# Patient Record
Sex: Female | Born: 1985 | Race: Black or African American | Hispanic: No | Marital: Married | Smoking: Never smoker
Health system: Southern US, Community
[De-identification: ages and names within clinical notes are randomized; demographics above are authoritative.]

## PROBLEM LIST (undated history)

## (undated) DIAGNOSIS — Z8742 Personal history of other diseases of the female genital tract: Secondary | ICD-10-CM

## (undated) DIAGNOSIS — E669 Obesity, unspecified: Secondary | ICD-10-CM

## (undated) DIAGNOSIS — O24419 Gestational diabetes mellitus in pregnancy, unspecified control: Secondary | ICD-10-CM

## (undated) HISTORY — PX: INTRAUTERINE DEVICE (IUD) INSERTION: SHX5877

## (undated) HISTORY — DX: Personal history of other diseases of the female genital tract: Z87.42

## (undated) HISTORY — DX: Obesity, unspecified: E66.9

## (undated) HISTORY — DX: Gestational diabetes mellitus in pregnancy, unspecified control: O24.419

---

## 2009-04-04 ENCOUNTER — Ambulatory Visit: Payer: Self-pay | Admitting: Family Medicine

## 2009-04-13 ENCOUNTER — Encounter: Payer: Self-pay | Admitting: Maternal & Fetal Medicine

## 2009-05-18 ENCOUNTER — Encounter: Payer: Self-pay | Admitting: Obstetrics and Gynecology

## 2009-05-23 ENCOUNTER — Encounter: Payer: Self-pay | Admitting: Obstetrics and Gynecology

## 2009-09-01 ENCOUNTER — Observation Stay: Payer: Self-pay | Admitting: Obstetrics and Gynecology

## 2009-09-05 ENCOUNTER — Inpatient Hospital Stay: Payer: Self-pay | Admitting: Obstetrics and Gynecology

## 2009-11-22 IMAGING — US US OB FOLLOW-UP - NRPT [facility]
1 series · 14 of 28 positions shown · non-contrast
Comparison: none

[Series 1: us ob follow-up - nrpt [facility] · 14 of 52 slices shown]
[im 2/52]
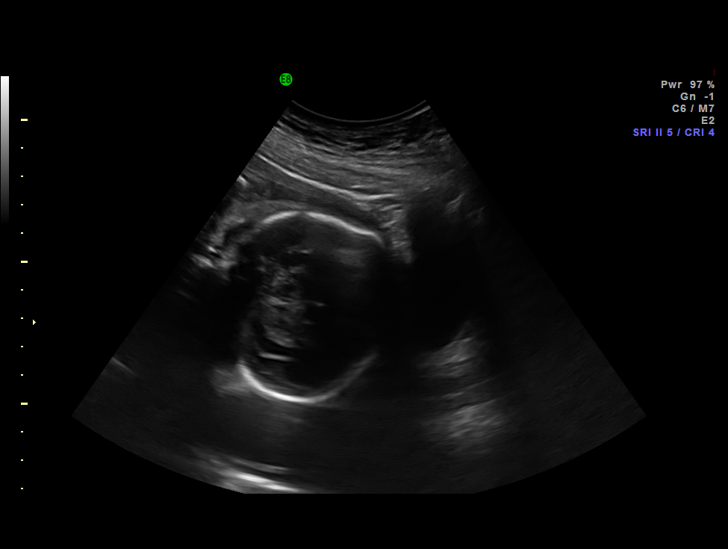
[im 6/52]
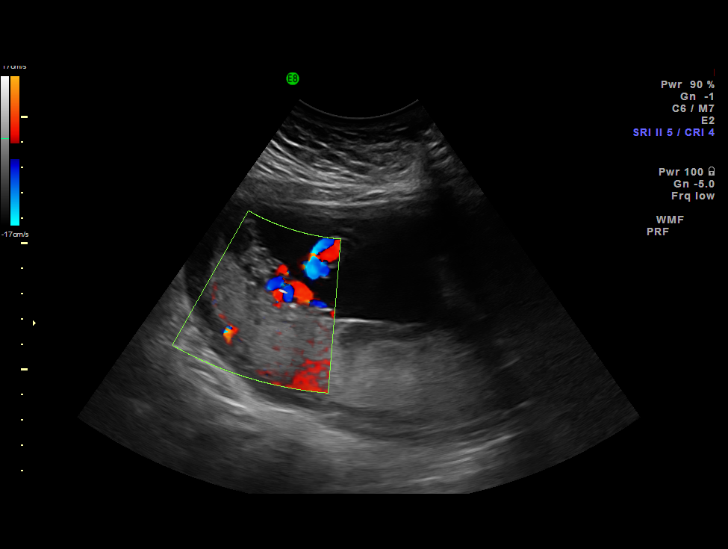
[im 10/52]
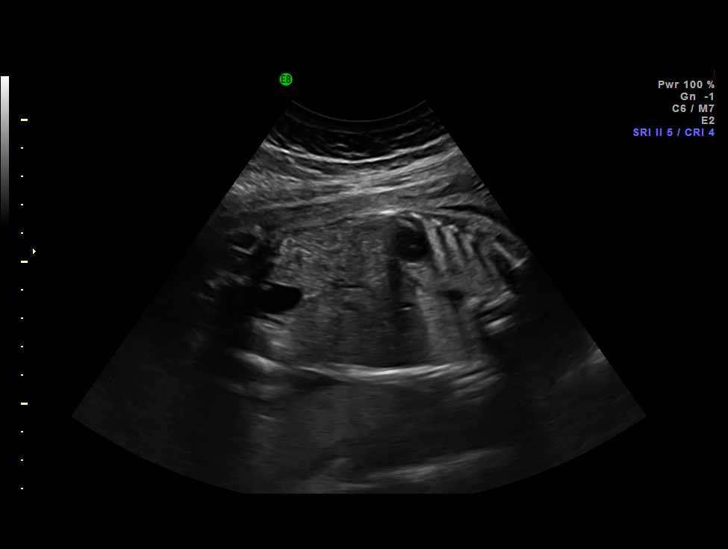
[im 14/52]
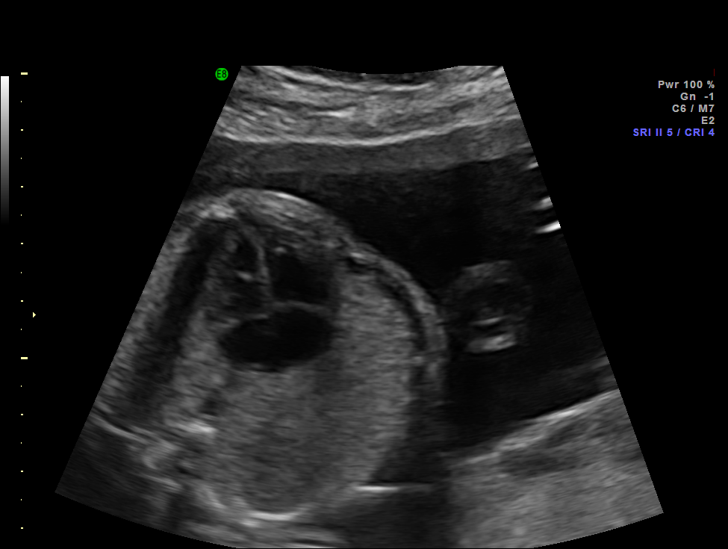
[im 18/52]
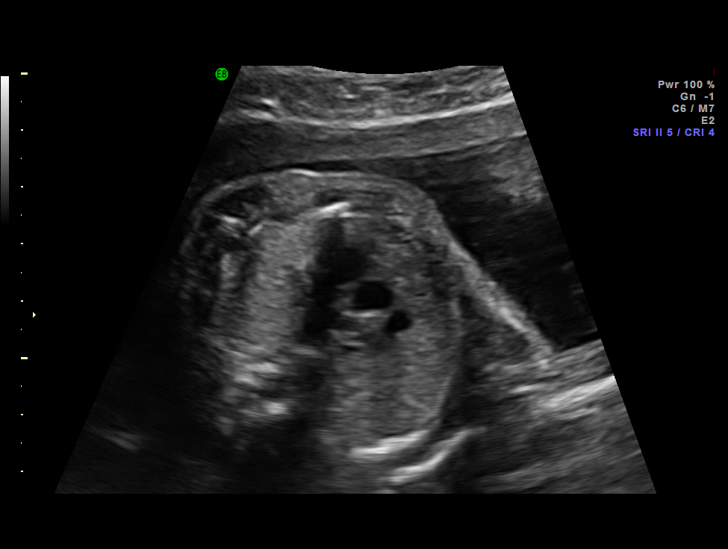
[im 21/52]
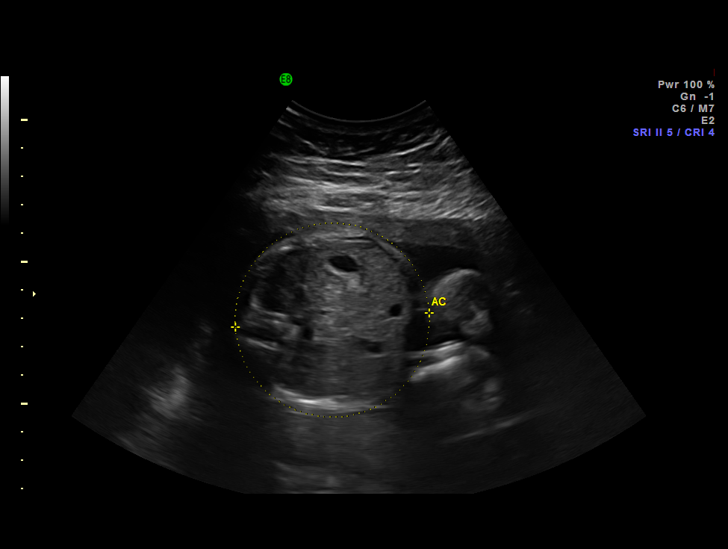
[im 25/52]
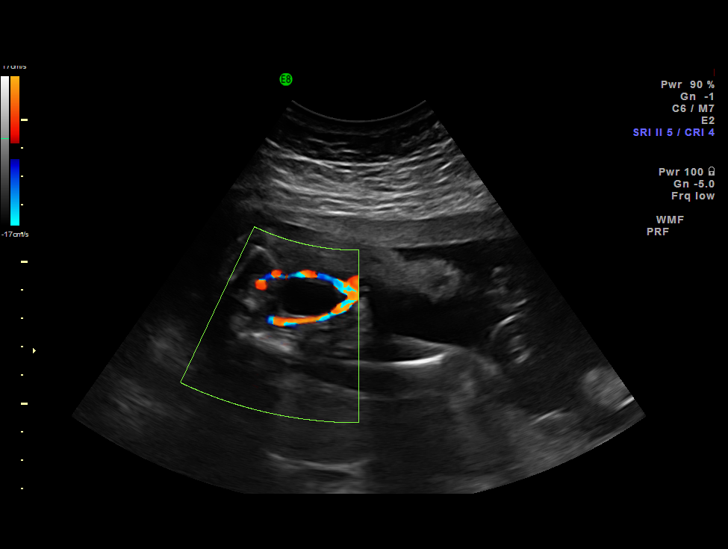
[im 29/52]
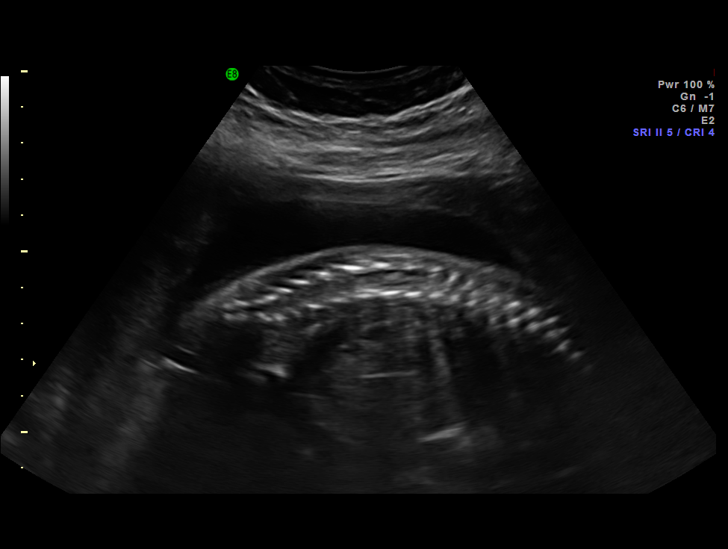
[im 33/52]
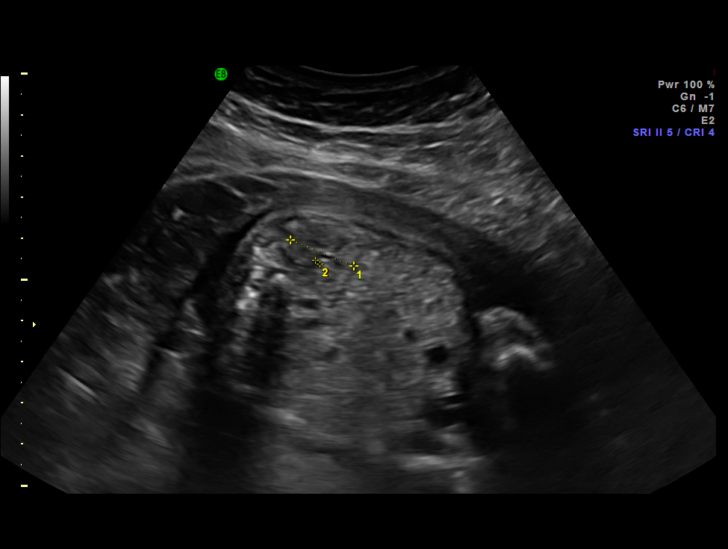
[im 36/52]
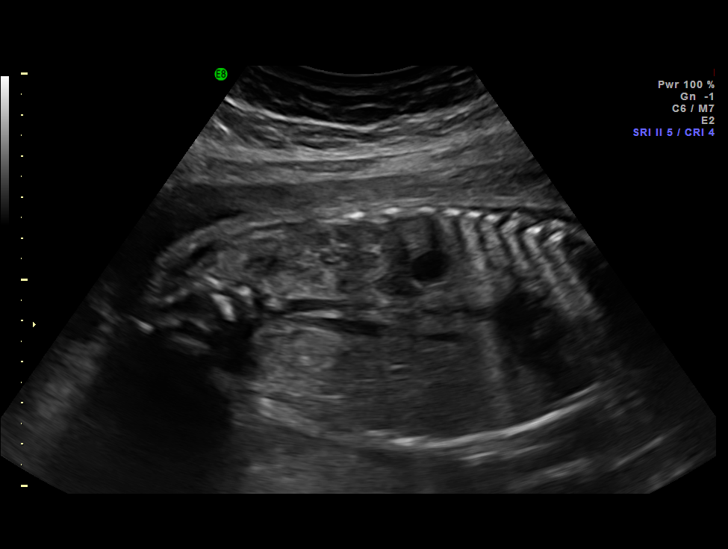
[im 40/52]
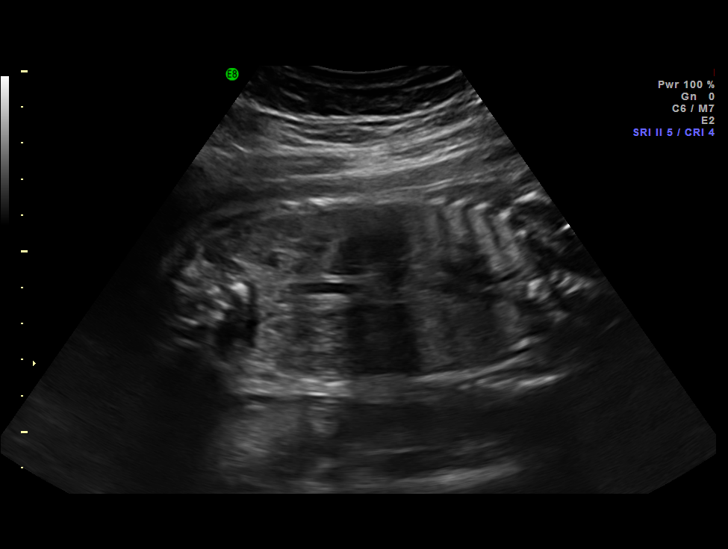
[im 44/52]
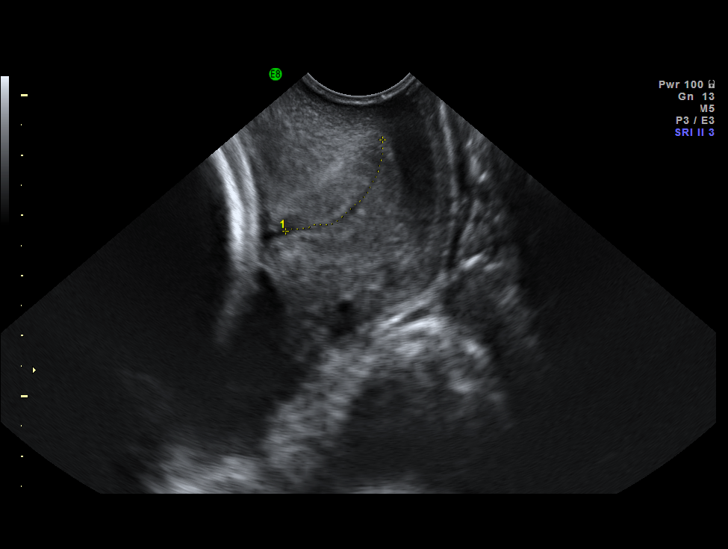
[im 48/52]
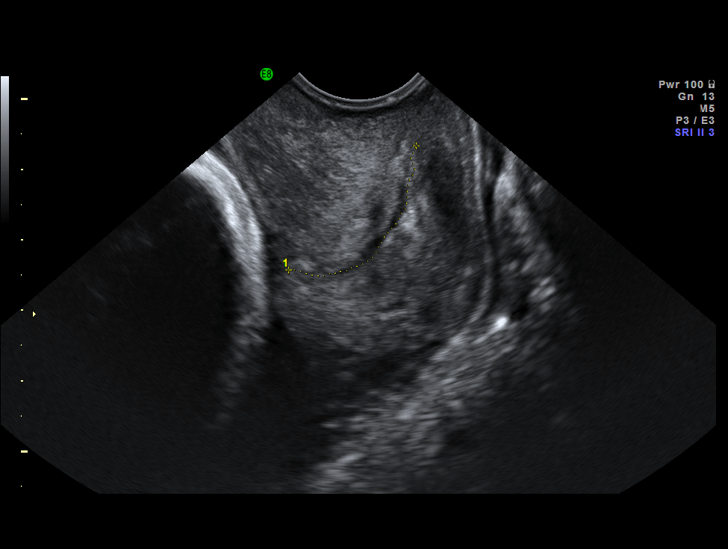
[im 52/52]
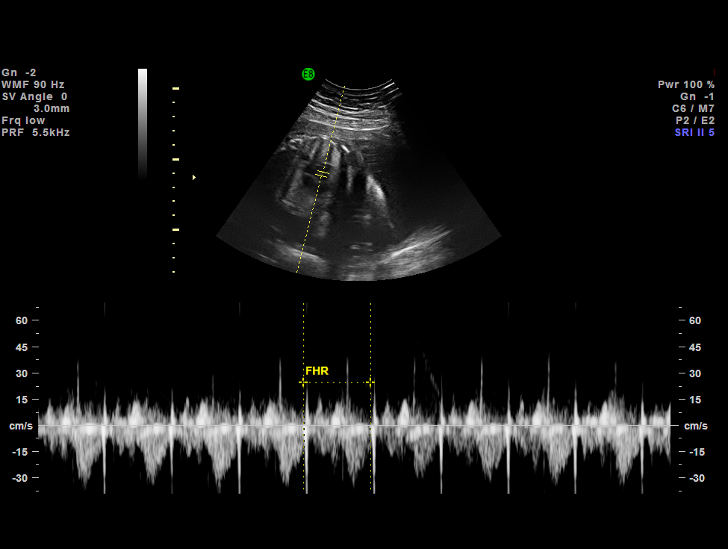

[14 of 28 positions shown; findings below may reference images not displayed]

IMAGES IMPORTED FROM THE [employer] WORKFLOW SYSTEM
NO DICTATION FOR STUDY

## 2010-11-10 ENCOUNTER — Emergency Department: Payer: Self-pay | Admitting: Emergency Medicine

## 2018-02-06 ENCOUNTER — Encounter: Payer: Self-pay | Admitting: Obstetrics and Gynecology

## 2018-02-06 ENCOUNTER — Ambulatory Visit: Payer: 59 | Admitting: Obstetrics and Gynecology

## 2018-02-06 VITALS — BP 120/70 | HR 81 | Ht 70.0 in | Wt 243.0 lb

## 2018-02-06 DIAGNOSIS — N760 Acute vaginitis: Secondary | ICD-10-CM | POA: Diagnosis not present

## 2018-02-06 MED ORDER — METRONIDAZOLE 500 MG PO TABS: 500.0000 mg | ORAL_TABLET | Freq: Two times a day (BID) | ORAL | 0 refills | Status: AC

## 2018-02-06 NOTE — Progress Notes (Signed)
Patient ID: Karina Mason, female   DOB: 10/08/1986, 32 y.o.   MRN: [id number]  Reason for Consult: Vaginitis   Referred by No ref. provider found  Subjective:     HPI:  Karina CrimesCherelle R Olivar is a 32 y.o. female patient presents today with complaints of abnormal vaginal discharge, pain and burning that has been present for about 1 week. She recently changed soap from dove to nivea.   OB History  Gravida Para Term Preterm AB Living  1 1 1     1   SAB TAB Ectopic Multiple Live Births          1    # Outcome Date GA Lbr Len/2nd Weight Sex Delivery Anes PTL Lv  1 Term 09/06/09    F Vag-Spont   LIV      Past Medical History:  Diagnosis Date  . History of abnormal cervical Pap smear    Family History  Problem Relation Age of Onset  . Hypertension Mother   . Diabetes Father   . Hypertension Father   . Stroke Maternal Grandmother   . Leukemia Paternal Grandfather    Past Surgical History:  Procedure Laterality Date  . INTRAUTERINE DEVICE (IUD) INSERTION      Short Social History:  Social History   Tobacco Use  . Smoking status: Never Smoker  . Smokeless tobacco: Never Used  Substance Use Topics  . Alcohol use: No    Frequency: Never    No Known Allergies  Current Outpatient Medications  Medication Sig Dispense Refill  . levonorgestrel (MIRENA) 20 MCG/24HR IUD 1 each by Intrauterine route once.     No current facility-administered medications for this visit.     Review of Systems  Constitutional: Negative for chills, fatigue, fever and unexpected weight change.  HENT: Negative for trouble swallowing.  Eyes: Negative for loss of vision.  Respiratory: Negative for cough, shortness of breath and wheezing.  Cardiovascular: Negative for chest pain, leg swelling, palpitations and syncope.  GI: Negative for abdominal pain, blood in stool, diarrhea, nausea and vomiting.  GU: Negative for difficulty urinating, dysuria, frequency and hematuria.  Musculoskeletal:  Negative for back pain, leg pain and joint pain.  Skin: Negative for rash.  Neurological: Negative for dizziness, headaches, light-headedness, numbness and seizures.  Psychiatric: Negative for behavioral problem, confusion, depressed mood and sleep disturbance.        Objective:  Objective   Vitals:   02/06/18 1440  BP: 120/70  Pulse: 81  Weight: 243 lb (110.2 kg)  Height: 5\' 10"  (1.778 m)   Body mass index is 34.87 kg/m.  Physical Exam  Constitutional: She is oriented to person, place, and time. She appears well-developed and well-nourished.  HENT:  Head: Normocephalic and atraumatic.  Eyes: EOM are normal.  Cardiovascular: Normal rate, regular rhythm and normal heart sounds.  Pulmonary/Chest: Effort normal and breath sounds normal.  Genitourinary: Vaginal discharge found.  Genitourinary Comments: Thin white vaginal discharge, +odor  Neurological: She is alert and oriented to person, place, and time.  Skin: Skin is warm and dry.  Psychiatric: She has a normal mood and affect. Her behavior is normal. Judgment and thought content normal.  Nursing note and vitals reviewed.   WETMOUNT: Microscopic wet-mount exam shows KOH done, whiff test positive. No clue cells, no yeast seen     Assessment/Plan:    Vaginitis-   Will start patient on flagyl. Will send NuSwab to confirm infection.  Natale Milchhristanna R Jazmynn Pho MD [facility] 02/06/18 3:08  PM

## 2018-02-10 LAB — NUSWAB BV AND CANDIDA, NAA
Candida albicans, NAA: POSITIVE — AB
Candida glabrata, NAA: NEGATIVE

## 2018-02-11 ENCOUNTER — Other Ambulatory Visit: Payer: Self-pay | Admitting: Obstetrics and Gynecology

## 2018-02-11 DIAGNOSIS — N760 Acute vaginitis: Secondary | ICD-10-CM

## 2018-02-11 MED ORDER — FLUCONAZOLE 150 MG PO TABS: 150.0000 mg | ORAL_TABLET | Freq: Once | ORAL | 3 refills | Status: AC

## 2018-02-11 NOTE — Progress Notes (Signed)
Patient will need to take diflucan for infection, I started her on flagyl which she can discontinue. I sent the prescription to her pharmacy. I called her but she did not answer and voicemail was full. Will you please try to contact patient? Thank you! -Dr. Jerene PitchSchuman

## 2018-03-26 ENCOUNTER — Encounter: Payer: Self-pay | Admitting: Obstetrics and Gynecology

## 2018-03-26 ENCOUNTER — Ambulatory Visit (INDEPENDENT_AMBULATORY_CARE_PROVIDER_SITE_OTHER): Payer: 59 | Admitting: Obstetrics and Gynecology

## 2018-03-26 VITALS — BP 128/82 | HR 70 | Ht 70.0 in | Wt 246.0 lb

## 2018-03-26 DIAGNOSIS — M79604 Pain in right leg: Secondary | ICD-10-CM | POA: Diagnosis not present

## 2018-03-26 DIAGNOSIS — Z124 Encounter for screening for malignant neoplasm of cervix: Secondary | ICD-10-CM

## 2018-03-26 DIAGNOSIS — Z01419 Encounter for gynecological examination (general) (routine) without abnormal findings: Secondary | ICD-10-CM | POA: Diagnosis not present

## 2018-03-26 DIAGNOSIS — Z1151 Encounter for screening for human papillomavirus (HPV): Secondary | ICD-10-CM | POA: Diagnosis not present

## 2018-03-26 DIAGNOSIS — Z30431 Encounter for routine checking of intrauterine contraceptive device: Secondary | ICD-10-CM

## 2018-03-26 NOTE — Patient Instructions (Signed)
I value your feedback and entrusting us with your care. If you get a [facility] patient survey, I would appreciate you taking the time to let us know about your experience today. Thank you! 

## 2018-03-26 NOTE — Progress Notes (Signed)
PCP:  Patient, No Pcp Per   Chief Complaint  Patient presents with  . Gynecologic Exam     HPI:      [name] is a 32 y.o. G1P1001 who LMP was No LMP recorded. (Menstrual status: IUD)., presents today for her annual examination.  Her menses are absent with IUD. Dysmenorrhea none. She does not have intermenstrual bleeding.  Sex activity: single partner, contraception - IUD. Mirena placed 01/14/17 Last Pap: July 08, 2017  Results were: no abnormalities  Hx of STDs: none  Vaginitis sx treated by Dr. Jerene PitchSchuman 2/19 resolved.   There is no FH of breast cancer. There is no FH of ovarian cancer. The patient does not do self-breast exams.  Tobacco use: The patient denies current or previous tobacco use. Alcohol use: none No drug use.  Exercise: not active  She does get adequate calcium and Vitamin D in her diet.  She notes RT leg pain when she first stands up or gets up. Has a sit down job. Sx resolve once she gets moving around. No exercise.    Past Medical History:  Diagnosis Date  . History of abnormal cervical Pap smear     Past Surgical History:  Procedure Laterality Date  . INTRAUTERINE DEVICE (IUD) INSERTION      Family History  Problem Relation Age of Onset  . Hypertension Mother   . Diabetes Father   . Hypertension Father   . Stroke Maternal Grandmother   . Leukemia Paternal Grandfather     Social History   Socioeconomic History  . Marital status: Married    Spouse name: Not on file  . Number of children: Not on file  . Years of education: Not on file  . Highest education level: Not on file  Occupational History  . Not on file  Social Needs  . Financial resource strain: Not on file  . Food insecurity:    Worry: Not on file    Inability: Not on file  . Transportation needs:    Medical: Not on file    Non-medical: Not on file  Tobacco Use  . Smoking status: Never Smoker  . Smokeless tobacco: Never Used  Substance and Sexual Activity    . Alcohol use: No    Frequency: Never  . Drug use: No  . Sexual activity: Yes    Birth control/protection: IUD    Comment: Mirena   Lifestyle  . Physical activity:    Days per week: 0 days    Minutes per session: 0 min  . Stress: To some extent  Relationships  . Social connections:    Talks on phone: Not on file    Gets together: Not on file    Attends religious service: Not on file    Active member of club or organization: Not on file    Attends meetings of clubs or organizations: Not on file    Relationship status: Not on file  . Intimate partner violence:    Fear of current or ex partner: Not on file    Emotionally abused: Not on file    Physically abused: Not on file    Forced sexual activity: Not on file  Other Topics Concern  . Not on file  Social History Narrative  . Not on file    Outpatient Medications Prior to Visit  Medication Sig Dispense Refill  . levonorgestrel (MIRENA) 20 MCG/24HR IUD 1 each by Intrauterine route once.     No facility-administered  medications prior to visit.       ROS:  Review of Systems  Constitutional: Positive for fatigue. Negative for fever and unexpected weight change.  Respiratory: Negative for cough, shortness of breath and wheezing.   Cardiovascular: Negative for chest pain, palpitations and leg swelling.  Gastrointestinal: Negative for blood in stool, constipation, diarrhea, nausea and vomiting.  Endocrine: Negative for cold intolerance, heat intolerance and polyuria.  Genitourinary: Negative for dyspareunia, dysuria, flank pain, frequency, genital sores, hematuria, menstrual problem, pelvic pain, urgency, vaginal bleeding, vaginal discharge and vaginal pain.  Musculoskeletal: Positive for arthralgias and myalgias. Negative for back pain and joint swelling.  Skin: Negative for rash.  Neurological: Negative for dizziness, syncope, light-headedness, numbness and headaches.  Hematological: Negative for adenopathy.   Psychiatric/Behavioral: Negative for agitation, confusion, sleep disturbance and suicidal ideas. The patient is not nervous/anxious.    BREAST: No symptoms   Objective: BP 128/82   Pulse 70   Ht 5\' 10"  (1.778 m)   Wt 246 lb (111.6 kg)   BMI 35.30 kg/m    Physical Exam  Constitutional: She is oriented to person, place, and time. She appears well-developed and well-nourished.  Genitourinary: Vagina normal and uterus normal. There is no rash or tenderness on the right labia. There is no rash or tenderness on the left labia. No erythema or tenderness in the vagina. No vaginal discharge found. Right adnexum does not display mass and does not display tenderness. Left adnexum does not display mass and does not display tenderness.  Cervix exhibits visible IUD strings. Cervix does not exhibit motion tenderness or polyp. Uterus is not enlarged or tender.  Neck: Normal range of motion. No thyromegaly present.  Cardiovascular: Normal rate, regular rhythm and normal heart sounds.  No murmur heard. Pulmonary/Chest: Effort normal and breath sounds normal. Right breast exhibits no mass, no nipple discharge, no skin change and no tenderness. Left breast exhibits no mass, no nipple discharge, no skin change and no tenderness.  Abdominal: Soft. There is no tenderness. There is no guarding.  Musculoskeletal: Normal range of motion.  Neurological: She is alert and oriented to person, place, and time. No cranial nerve deficit.  Psychiatric: She has a normal mood and affect. Her behavior is normal.  Vitals reviewed.   Assessment/Plan: Encounter for annual routine gynecological examination  Cervical cancer screening - Plan: IGP, Aptima HPV  Screening for HPV (human papillomavirus) - Plan: IGP, Aptima HPV  Encounter for routine checking of intrauterine contraceptive device (IUD) - IUD in place. Due for rem 1/23.  Pain of right lower extremity - After sitting. Increase exercise/stretch/chiro. F/u prn.       GYN counsel adequate intake of calcium and vitamin D, diet and exercise     F/U  Return in about 1 year (around 03/27/2019).  Alicia B. Copland, PA-C 03/26/2018 11:51 AM

## 2018-03-30 LAB — IGP, APTIMA HPV
HPV Aptima: NEGATIVE
PAP Smear Comment: 0

## 2018-06-22 ENCOUNTER — Ambulatory Visit: Payer: 59 | Admitting: Advanced Practice Midwife

## 2019-01-13 ENCOUNTER — Ambulatory Visit: Payer: 59 | Admitting: Obstetrics and Gynecology

## 2019-01-21 ENCOUNTER — Ambulatory Visit (INDEPENDENT_AMBULATORY_CARE_PROVIDER_SITE_OTHER): Payer: 59 | Admitting: Obstetrics and Gynecology

## 2019-01-21 ENCOUNTER — Encounter: Payer: Self-pay | Admitting: Obstetrics and Gynecology

## 2019-01-21 VITALS — BP 130/80 | HR 58 | Ht 70.0 in | Wt 227.0 lb

## 2019-01-21 DIAGNOSIS — Z30432 Encounter for removal of intrauterine contraceptive device: Secondary | ICD-10-CM | POA: Diagnosis not present

## 2019-01-21 DIAGNOSIS — Z3169 Encounter for other general counseling and advice on procreation: Secondary | ICD-10-CM

## 2019-01-21 NOTE — Patient Instructions (Signed)
I value your feedback and entrusting us with your care. If you get a [facility] patient survey, I would appreciate you taking the time to let us know about your experience today. Thank you! 

## 2019-01-21 NOTE — Progress Notes (Signed)
   Chief Complaint  Patient presents with  . IUD removed     History of Present Illness:  Norma Zakarian Blancett is a 33 y.o. that had a Mirena IUD placed approximately 2 years ago. Since that time, she denies dyspareunia, pelvic pain, non-menstrual bleeding, vaginal d/c, heavy bleeding. She would like IUD removed in order to conceive.    BP 130/80   Pulse (!) 58   Ht 5\' 10"  (1.778 m)   Wt 227 lb (103 kg)   LMP 01/20/2019 (Exact Date)   BMI 32.57 kg/m   Pelvic exam:  Two IUD strings present seen coming from the cervical os. EGBUS, vaginal vault and cervix: within normal limits  IUD Removal Strings of IUD identified and grasped.  IUD removed without problem with ring forceps.  Pt tolerated this well.  IUD noted to be intact.  Assessment:  Encounter for removal of intrauterine contraceptive device (IUD)  Pre-conception counseling - Start PNVs.    Plan: IUD removed and plan for contraception is no method. She was amenable to this plan. Return in about 3 months (around 04/22/2019) for annual.   Annual due 4/20 if not pregnant.  Alicia B. Copland, PA-C 01/21/2019 3:58 PM

## 2019-03-17 ENCOUNTER — Telehealth: Payer: Self-pay | Admitting: Obstetrics and Gynecology

## 2019-03-17 NOTE — Telephone Encounter (Signed)
Spoke with pt. Having nausea today, vomiting last night. No pelvic pain, no other sx. Occas breast tenderness. Trying to conceive, not taking PNVs yet. Suggested she wait another couple days to take another first AM UPT. F/u prn NOB.

## 2019-03-17 NOTE — Telephone Encounter (Signed)
Late for period, had neg UPT yesterday. Had vomiting last night, feels sick, horrible today. When should she come in?

## 2019-06-10 ENCOUNTER — Telehealth: Payer: Self-pay

## 2019-06-10 NOTE — Telephone Encounter (Signed)
Please advise 

## 2019-06-10 NOTE — Telephone Encounter (Signed)
Pt calling; period is being short; the last 65m it's lasted only 2d; it's not light; this is different for her; is trying to conceive; concerned something isn't right.  Is there anything else she can do to have a better chance of conceiving?  [phone]

## 2019-06-10 NOTE — Telephone Encounter (Signed)
Have pt confirm not pregnant with UPT. If neg, can still conceive if flow is light.

## 2019-06-11 NOTE — Telephone Encounter (Signed)
Pt is aware.  

## 2019-07-15 NOTE — Patient Instructions (Signed)
I value your feedback and entrusting us with your care. If you get a [facility] patient survey, I would appreciate you taking the time to let us know about your experience today. Thank you! 

## 2019-07-15 NOTE — Progress Notes (Addendum)
PCP:  Patient, No Pcp Per   Chief Complaint  Patient presents with   Gynecologic Exam    pt would like UPT     HPI:      [name] is a 33 y.o. G1P1001 who LMP was Patient's last menstrual period was 06/07/2019 (exact date)., presents today for her annual examination.  Her menses are monthly, lasting 3 days, no BTB, Dysmenorrhea none. She is late for her period this month, trying to conceive. Not taking PNVs. Has had a little cramping, no spotting. Mild breast tenderness.  Sex activity: single partner, contraception - none, wants to conceive. Mirena placed 01/14/17, removed 1/20 for conception.  Last Pap: 03/26/18 Results were: no abnormalities /neg HPV DNA Hx of STDs: none  There is no FH of breast cancer. There is no FH of ovarian cancer. The patient does not do self-breast exams.  Tobacco use: The patient denies current or previous tobacco use. Alcohol use: none No drug use.  Exercise: mod active  She does get adequate calcium and Vitamin D in her diet.   Past Medical History:  Diagnosis Date   History of abnormal cervical Pap smear     Past Surgical History:  Procedure Laterality Date   INTRAUTERINE DEVICE (IUD) INSERTION      Family History  Problem Relation Age of Onset   Hypertension Mother    Diabetes Father        Type 2   Hypertension Father    Stroke Maternal Grandmother    Leukemia Paternal Grandfather    Hypertension Sister 30   Hypertension Brother [id number]   Other Maternal Grandfather        Malignant neoplasm    Social History   Socioeconomic History   Marital status: Married    Spouse name: Not on file   Number of children: Not on file   Years of education: Not on file   Highest education level: Not on file  Occupational History   Not on file  Social Needs   Financial resource strain: Not on file   Food insecurity    Worry: Not on file    Inability: Not on file   Transportation needs    Medical: Not on file      Non-medical: Not on file  Tobacco Use   Smoking status: Never Smoker   Smokeless tobacco: Never Used  Substance and Sexual Activity   Alcohol use: No    Frequency: Never   Drug use: No   Sexual activity: Yes    Birth control/protection: None  Lifestyle   Physical activity    Days per week: 0 days    Minutes per session: 0 min   Stress: To some extent  Relationships   Social connections    Talks on phone: Not on file    Gets together: Not on file    Attends religious service: Not on file    Active member of club or organization: Not on file    Attends meetings of clubs or organizations: Not on file    Relationship status: Not on file   Intimate partner violence    Fear of current or ex partner: Not on file    Emotionally abused: Not on file    Physically abused: Not on file    Forced sexual activity: Not on file  Other Topics Concern   Not on file  Social History Narrative   Not on file    No outpatient medications prior to  visit.   No facility-administered medications prior to visit.       ROS:  Review of Systems  Constitutional: Positive for fatigue. Negative for fever and unexpected weight change.  Respiratory: Negative for cough, shortness of breath and wheezing.   Cardiovascular: Negative for chest pain, palpitations and leg swelling.  Gastrointestinal: Negative for blood in stool, constipation, diarrhea, nausea and vomiting.  Endocrine: Negative for cold intolerance, heat intolerance and polyuria.  Genitourinary: Negative for dyspareunia, dysuria, flank pain, frequency, genital sores, hematuria, menstrual problem, pelvic pain, urgency, vaginal bleeding, vaginal discharge and vaginal pain.  Musculoskeletal: Positive for arthralgias and myalgias. Negative for back pain and joint swelling.  Skin: Negative for rash.  Neurological: Negative for dizziness, syncope, light-headedness, numbness and headaches.  Hematological: Negative for adenopathy.   Psychiatric/Behavioral: Negative for agitation, confusion, sleep disturbance and suicidal ideas. The patient is not nervous/anxious.    BREAST: No symptoms   Objective: BP 120/70    Ht 5\' 10"  (1.778 m)    Wt 259 lb (117.5 kg)    LMP 06/07/2019 (Exact Date)    BMI 37.16 kg/m    Physical Exam Constitutional:      Appearance: She is well-developed.  Genitourinary:     Vagina and uterus normal.     No vaginal discharge, erythema or tenderness.     No cervical motion tenderness or polyp.     IUD strings visualized.     Uterus is not enlarged or tender.     No right or left adnexal mass present.     Right adnexa not tender.     Left adnexa not tender.  Neck:     Musculoskeletal: Normal range of motion.     Thyroid: No thyromegaly.  Cardiovascular:     Rate and Rhythm: Normal rate and regular rhythm.     Heart sounds: Normal heart sounds. No murmur.  Pulmonary:     Effort: Pulmonary effort is normal.     Breath sounds: Normal breath sounds.  Chest:     Breasts:        Right: No mass, nipple discharge, skin change or tenderness.        Left: No mass, nipple discharge, skin change or tenderness.  Abdominal:     Palpations: Abdomen is soft.     Tenderness: There is no abdominal tenderness. There is no guarding.  Musculoskeletal: Normal range of motion.  Neurological:     Mental Status: She is alert and oriented to person, place, and time.     Cranial Nerves: No cranial nerve deficit.  Psychiatric:        Behavior: Behavior normal.  Vitals signs reviewed.     Assessment/Plan: Encounter for annual routine gynecological examination -   Positive pregnancy test - Plan: POCT urine pregnancy, EDD 03/11/20, 5 5/7 wks. RTO for NOB. Start PNVs. Gon/chlam screen today.     Screening for STD  GYN counsel adequate intake of calcium and vitamin D, diet and exercise     F/U  Return in about 2 weeks (around 07/30/2019) for NOB.  Kinzly Pierrelouis B. Miyanna Wiersma, PA-C 07/16/2019 8:57 AM

## 2019-07-16 ENCOUNTER — Encounter: Payer: Self-pay | Admitting: Obstetrics and Gynecology

## 2019-07-16 ENCOUNTER — Other Ambulatory Visit (HOSPITAL_COMMUNITY)
Admission: RE | Admit: 2019-07-16 | Discharge: 2019-07-16 | Disposition: A | Discharge status: Home / Self Care | Payer: 59 | Source: Ambulatory Visit | Attending: Obstetrics and Gynecology | Admitting: Obstetrics and Gynecology

## 2019-07-16 ENCOUNTER — Other Ambulatory Visit: Payer: Self-pay

## 2019-07-16 ENCOUNTER — Ambulatory Visit (INDEPENDENT_AMBULATORY_CARE_PROVIDER_SITE_OTHER): Payer: 59 | Admitting: Obstetrics and Gynecology

## 2019-07-16 VITALS — BP 120/70 | Ht 70.0 in | Wt 259.0 lb

## 2019-07-16 DIAGNOSIS — Z01419 Encounter for gynecological examination (general) (routine) without abnormal findings: Secondary | ICD-10-CM | POA: Diagnosis not present

## 2019-07-16 DIAGNOSIS — Z3201 Encounter for pregnancy test, result positive: Secondary | ICD-10-CM | POA: Diagnosis not present

## 2019-07-16 DIAGNOSIS — Z113 Encounter for screening for infections with a predominantly sexual mode of transmission: Secondary | ICD-10-CM | POA: Insufficient documentation

## 2019-07-16 LAB — POCT URINE PREGNANCY: Preg Test, Ur: POSITIVE — AB

## 2019-07-17 LAB — CERVICOVAGINAL ANCILLARY ONLY
Chlamydia: NEGATIVE
Neisseria Gonorrhea: NEGATIVE

## 2019-08-04 ENCOUNTER — Other Ambulatory Visit: Payer: Self-pay

## 2019-08-04 ENCOUNTER — Encounter: Payer: Self-pay | Admitting: Maternal Newborn

## 2019-08-04 ENCOUNTER — Ambulatory Visit (INDEPENDENT_AMBULATORY_CARE_PROVIDER_SITE_OTHER): Payer: 59 | Admitting: Maternal Newborn

## 2019-08-04 VITALS — BP 130/80 | Wt 261.0 lb

## 2019-08-04 DIAGNOSIS — Z6837 Body mass index (BMI) 37.0-37.9, adult: Secondary | ICD-10-CM

## 2019-08-04 DIAGNOSIS — O0990 Supervision of high risk pregnancy, unspecified, unspecified trimester: Secondary | ICD-10-CM | POA: Insufficient documentation

## 2019-08-04 DIAGNOSIS — Z3481 Encounter for supervision of other normal pregnancy, first trimester: Secondary | ICD-10-CM

## 2019-08-04 DIAGNOSIS — Z369 Encounter for antenatal screening, unspecified: Secondary | ICD-10-CM

## 2019-08-04 DIAGNOSIS — Z3A08 8 weeks gestation of pregnancy: Secondary | ICD-10-CM

## 2019-08-04 DIAGNOSIS — Z3480 Encounter for supervision of other normal pregnancy, unspecified trimester: Secondary | ICD-10-CM

## 2019-08-04 HISTORY — DX: Supervision of high risk pregnancy, unspecified, unspecified trimester: O09.90

## 2019-08-04 NOTE — Progress Notes (Signed)
08/04/2019   Chief Complaint: Desires prenatal care.  Transfer of Care Patient: No  History of Present Illness: [name] is a 33 y.o. G2P1001 at 2968w2d based on Patient's last menstrual period on 06/07/2019 (exact date), with an Estimated Date of Delivery: 03/13/2020, with the above CC.   Her periods were: regular periods every month lasting 3-4 days. Her LMP was short and light, lasting 2 days. She has Negative signs or symptoms of nausea/vomiting of pregnancy. She has Negative signs or symptoms of miscarriage or preterm labor She identifies Negative Zika risk factors for her and her partner On any different medications around the time she conceived/early pregnancy: No  History of varicella: unknown  Review of Systems  Constitutional: Positive for malaise/fatigue.       Change in appetite  Eyes: Negative.   Respiratory: Negative for shortness of breath and wheezing.   Cardiovascular: Negative for chest pain and palpitations.  Gastrointestinal: Positive for diarrhea. Negative for abdominal pain, nausea and vomiting.  Genitourinary: Negative.   Musculoskeletal: Negative.   Skin: Positive for itching. Negative for rash.       Itching of skin on breasts  Neurological: Positive for headaches.  Endo/Heme/Allergies: Negative.   Psychiatric/Behavioral:       Hormonal mood changes  Breasts: Positive for breast tenderness. Negative for new or changing breast lumps  Review of systems was otherwise negative, except as stated in the above HPI.  OBGYN History: As per HPI. OB History  Gravida Para Term Preterm AB Living  2 1 1     1   SAB TAB Ectopic Multiple Live Births          1    # Outcome Date GA Lbr Len/2nd Weight Sex Delivery Anes PTL Lv  2 Current           1 Term 09/06/09    F Vag-Spont   LIV    Any issues with any prior pregnancies: no Any prior children are healthy, doing well, without any problems or issues: yes History of pap smears: Yes. Last pap smear 03/26/2018.  NILM/HPV Negative History of STIs: No   Past Medical History: Past Medical History:  Diagnosis Date  . History of abnormal cervical Pap smear     Past Surgical History: Past Surgical History:  Procedure Laterality Date  . INTRAUTERINE DEVICE (IUD) INSERTION      Family History:  Family History  Problem Relation Age of Onset  . Hypertension Mother   . Diabetes Father        Type 2  . Hypertension Father   . Stroke Maternal Grandmother   . Leukemia Paternal Grandfather   . Hypertension Sister [id number]  . Hypertension Brother 30  . Other Maternal Grandfather        Malignant neoplasm    She denies any female cancers, bleeding or blood clotting disorders.  She denies any history of intellectual disability, birth defects or genetic disorders in her or the FOB's history  Social History:  Social History   Socioeconomic History  . Marital status: Married    Spouse name: Not on file  . Number of children: Not on file  . Years of education: Not on file  . Highest education level: Not on file  Occupational History  . Not on file  Social Needs  . Financial resource strain: Not on file  . Food insecurity    Worry: Not on file    Inability: Not on file  . Transportation needs  Medical: Not on file    Non-medical: Not on file  Tobacco Use  . Smoking status: Never Smoker  . Smokeless tobacco: Never Used  Substance and Sexual Activity  . Alcohol use: No    Frequency: Never  . Drug use: No  . Sexual activity: Yes    Birth control/protection: None  Lifestyle  . Physical activity    Days per week: 0 days    Minutes per session: 0 min  . Stress: To some extent  Relationships  . Social Herbalist on phone: Not on file    Gets together: Not on file    Attends religious service: Not on file    Active member of club or organization: Not on file    Attends meetings of clubs or organizations: Not on file    Relationship status: Not on file  . Intimate partner  violence    Fear of current or ex partner: Not on file    Emotionally abused: Not on file    Physically abused: Not on file    Forced sexual activity: Not on file  Other Topics Concern  . Not on file  Social History Narrative  . Not on file   Any cats in the household: no Domestic violence screening is negative.  Allergy: No Known Allergies  Current Outpatient Medications: No current outpatient medications on file.   Physical Exam:   BP 130/80   Wt 261 lb (118.4 kg)   LMP 06/07/2019 (Exact Date)   BMI 37.45 kg/m  Body mass index is 37.45 kg/m. Constitutional: Well nourished, well developed female in no acute distress.  Neck:  Supple, normal appearance, and no thyromegaly  Cardiovascular: S1, S2 normal, no murmur, rub or gallop, regular rate and rhythm Respiratory:  Clear to auscultation bilaterally. Normal respiratory effort Abdomen: No masses, hernias; diffusely non tender to palpation, non distended Breasts: Patient declines to have breast exam. Neuro/Psych:  Normal mood and affect.  Skin:  Warm and dry.  Lymphatic:  No inguinal lymphadenopathy.   Pelvic exam: declined after shared decision making, had annual exam on 7/24 with negative Aptima and no current problems  Assessment: Karina Mason is a 33 y.o. G2P1001 [redacted]w[redacted]d based on Patient's last menstrual period on 06/07/2019 (exact date), with an Estimated Date of Delivery: 03/13/2020, presenting for prenatal care.  Plan:  1) Avoid alcoholic beverages. 2) Patient encouraged not to smoke.  3) Discontinue the use of all non-medicinal drugs and chemicals.  4) Take prenatal vitamins daily.  5) Seatbelt use advised 6) Nutrition, food safety (fish, cheese advisories, and high nitrite foods) and exercise discussed. 7) Hospital and practice style delivering at [facility] discussed  8) Patient is asked about travel to areas at risk for the [place] virus, and counseled to avoid travel and exposure to mosquitoes or partners who may have  themselves been exposed to the virus.  9) Childbirth classes at [facility] advised 10) Genetic Screening, such as with 1st Trimester Screening, cell free fetal DNA, AFP testing, and Ultrasound, is discussed with patient. She plans to decline genetic testing this pregnancy. 11) Aptima done at annual exam 7/24 with negative results. 12) Early GTT and NOB labs ordered, patient to return for tests 13) Dating scan ordered  Problem list reviewed and updated.  Avel Sensor, CNM [facility], [facility] 08/04/2019  2:57 PM

## 2019-08-05 LAB — URINE DRUG PANEL 7
Amphetamines, Urine: NEGATIVE ng/mL
Barbiturate Quant, Ur: NEGATIVE ng/mL
Benzodiazepine Quant, Ur: NEGATIVE ng/mL
Cannabinoid Quant, Ur: NEGATIVE ng/mL
Cocaine (Metab.): NEGATIVE ng/mL
Opiate Quant, Ur: NEGATIVE ng/mL
PCP Quant, Ur: NEGATIVE ng/mL

## 2019-08-06 LAB — URINE CULTURE

## 2019-08-16 ENCOUNTER — Encounter: Payer: Self-pay | Admitting: Obstetrics & Gynecology

## 2019-08-16 ENCOUNTER — Ambulatory Visit (INDEPENDENT_AMBULATORY_CARE_PROVIDER_SITE_OTHER): Payer: 59 | Admitting: Obstetrics & Gynecology

## 2019-08-16 ENCOUNTER — Ambulatory Visit (INDEPENDENT_AMBULATORY_CARE_PROVIDER_SITE_OTHER): Payer: 59

## 2019-08-16 ENCOUNTER — Other Ambulatory Visit: Payer: Self-pay

## 2019-08-16 ENCOUNTER — Other Ambulatory Visit: Payer: 59

## 2019-08-16 VITALS — BP 120/80 | Wt 263.0 lb

## 2019-08-16 DIAGNOSIS — N8311 Corpus luteum cyst of right ovary: Secondary | ICD-10-CM

## 2019-08-16 DIAGNOSIS — Z3480 Encounter for supervision of other normal pregnancy, unspecified trimester: Secondary | ICD-10-CM

## 2019-08-16 DIAGNOSIS — Z3A10 10 weeks gestation of pregnancy: Secondary | ICD-10-CM

## 2019-08-16 DIAGNOSIS — Z6837 Body mass index (BMI) 37.0-37.9, adult: Secondary | ICD-10-CM

## 2019-08-16 DIAGNOSIS — Z3481 Encounter for supervision of other normal pregnancy, first trimester: Secondary | ICD-10-CM

## 2019-08-16 DIAGNOSIS — O3481 Maternal care for other abnormalities of pelvic organs, first trimester: Secondary | ICD-10-CM | POA: Diagnosis not present

## 2019-08-16 DIAGNOSIS — Z369 Encounter for antenatal screening, unspecified: Secondary | ICD-10-CM

## 2019-08-16 MED ORDER — CONCEPT OB 130-92.4-1 MG PO CAPS: 1.0000 | ORAL_CAPSULE | Freq: Every day | ORAL | 11 refills | Status: DC

## 2019-08-16 NOTE — Progress Notes (Signed)
  Subjective  No nausea Does not tolerate PNV well  Objective  BP 120/80   Wt 263 lb (119.3 kg)   LMP 06/07/2019 (Exact Date)   BMI 37.74 kg/m  General: NAD Pumonary: no increased work of breathing Abdomen: gravid, non-tender Extremities: no edema Psychiatric: mood appropriate, affect full  Assessment  33 y.o. G2P1001 at [redacted]w[redacted]d by  03/13/2020, by Last Menstrual Period presenting for routine prenatal visit  Plan   Problem List Items Addressed This Visit      Other   Supervision of other normal pregnancy, antepartum    Other Visit Diagnoses    [redacted] weeks gestation of pregnancy    -  Primary   BMI 37.0-37.9, adult        Review of ULTRASOUND.    I have personally reviewed images and report of recent ultrasound done [facility].    Plan of management to be discussed with patient.    Keep EDC as agrees to within 6 days and pt had reg cycles leading up to pregnancy  Change PNV to [facility] for tolerability   pregnancy2 Problems (from 06/07/19 to present)    Problem Noted Resolved   Supervision of other normal pregnancy, antepartum 08/04/2019 by Rexene Agent, CNM No   Overview Addendum 08/16/2019  2:59 PM by Gae Dry, MD    [facility] Prenatal [facility]  Dating LMP, [place] agrees Blood type:     Genetic Screen declines Antibody:   Anatomic [place]  Rubella:   Varicella:    GTT Early:           Third trimester:  RPR:     Rhogam  HBsAg:     TDaP vaccine                       Flu Shot: HIV:     Baby Food                                GBS:   Contraception  Pap: 03/26/2018, NLIM/HPV Negative            Barnett Applebaum, MD, [facility], [facility] 08/16/2019  3:00 PM

## 2019-08-16 NOTE — Patient Instructions (Signed)

## 2019-08-19 LAB — RPR+RH+ABO+RUB AB+AB SCR+CB...
Antibody Screen: NEGATIVE
HIV Screen 4th Generation wRfx: NONREACTIVE
Hematocrit: 34.9 % (ref 34.0–46.6)
Hemoglobin: 12 g/dL (ref 11.1–15.9)
Hepatitis B Surface Ag: NEGATIVE
MCH: 31.1 pg (ref 26.6–33.0)
MCHC: 34.4 g/dL (ref 31.5–35.7)
MCV: 90 fL (ref 79–97)
Platelets: 269 10*3/uL (ref 150–450)
RBC: 3.86 x10E6/uL (ref 3.77–5.28)
RDW: 12.1 % (ref 11.7–15.4)
RPR Ser Ql: NONREACTIVE
Rh Factor: POSITIVE
Rubella Antibodies, IGG: 1.36 index (ref >0.99)
Varicella zoster IgG: 680 index (ref >165)
WBC: 9.1 10*3/uL (ref 3.4–10.8)

## 2019-08-19 LAB — HEMOGLOBINOPATHY EVALUATION
HGB C: 0 %
HGB S: 0 %
HGB VARIANT: 0 %
Hemoglobin A2 Quantitation: 2.3 % (ref 1.8–3.2)
Hemoglobin F Quantitation: 0 % (ref 0.0–2.0)
Hgb A: 97.7 % (ref 96.4–98.8)

## 2019-08-19 LAB — GLUCOSE, 1 HOUR GESTATIONAL: Gestational Diabetes Screen: 147 mg/dL — ABNORMAL HIGH (ref 65–139)

## 2019-08-22 ENCOUNTER — Other Ambulatory Visit: Payer: Self-pay | Admitting: Maternal Newborn

## 2019-08-22 DIAGNOSIS — R7309 Other abnormal glucose: Secondary | ICD-10-CM

## 2019-08-22 NOTE — Progress Notes (Signed)
3 hour GTT ordered, patient is aware to schedule lab visit for test.

## 2019-08-28 ENCOUNTER — Encounter: Payer: Self-pay | Admitting: Obstetrics & Gynecology

## 2019-09-01 ENCOUNTER — Encounter: Payer: Self-pay | Admitting: Obstetrics & Gynecology

## 2019-09-02 NOTE — Telephone Encounter (Signed)
Can you schedule depression appointment with anyone

## 2019-09-02 NOTE — Telephone Encounter (Signed)
Called and spoke with patient to schedule appointment. No opeening today next available appointment for patient to come in is on Wednesday 09/08/19

## 2019-09-02 NOTE — Telephone Encounter (Signed)
She would need appt with any OB CNM or MD today

## 2019-09-02 NOTE — Telephone Encounter (Signed)
Please advise , does she need to come in for appointment

## 2019-09-03 ENCOUNTER — Other Ambulatory Visit: Payer: Self-pay

## 2019-09-03 ENCOUNTER — Ambulatory Visit (INDEPENDENT_AMBULATORY_CARE_PROVIDER_SITE_OTHER): Payer: 59 | Admitting: Maternal Newborn

## 2019-09-03 VITALS — BP 110/70 | Wt 259.0 lb

## 2019-09-03 DIAGNOSIS — Z3A12 12 weeks gestation of pregnancy: Secondary | ICD-10-CM

## 2019-09-03 DIAGNOSIS — O99340 Other mental disorders complicating pregnancy, unspecified trimester: Secondary | ICD-10-CM

## 2019-09-03 DIAGNOSIS — F419 Anxiety disorder, unspecified: Secondary | ICD-10-CM

## 2019-09-03 DIAGNOSIS — O99343 Other mental disorders complicating pregnancy, third trimester: Secondary | ICD-10-CM

## 2019-09-03 DIAGNOSIS — F329 Major depressive disorder, single episode, unspecified: Secondary | ICD-10-CM

## 2019-09-03 MED ORDER — SERTRALINE HCL 50 MG PO TABS: 50.0000 mg | ORAL_TABLET | Freq: Every day | ORAL | 2 refills | Status: DC

## 2019-09-03 NOTE — Progress Notes (Signed)
     Prenatal Problem Visit  Subjective  Karina Mason is a 33 y.o. G2P1001 at [redacted]w[redacted]d being seen today for ongoing prenatal care.  She is currently monitored for the following issues for this low-risk pregnancy and has Supervision of other normal pregnancy, antepartum on their problem list.  ----------------------------------------------------------------------------------- Patient reports increasing symptoms of anxiety and depression. She has some situational factors with her partner that have made these symptoms much worse in the past few days. Endorses fleeting thoughts that she would be better off dead, but has no intent nor plan to harm herself or others.  Vag. Bleeding: None.  No leaking of fluid.  ----------------------------------------------------------------------------------- The following portions of the patient's history were reviewed and updated as appropriate: allergies, current medications, past family history, past medical history, past social history, past surgical history and problem list. Problem list updated.   Objective  Blood pressure 110/70, weight 259 lb (117.5 kg), last menstrual period 06/07/2019. Pregravid weight 254 lb (115.2 kg) Total Weight Gain 5 lb (2.268 kg)   General:  Alert, oriented and cooperative. Tearful at times.  Skin: Skin is warm and dry. No rash noted.   Respiratory: Normal respiratory effort, no problems with respiration noted  Pelvic:  Cervical exam deferred        Extremities: Normal range of motion.     Mental Status: Normal mood and affect. Normal behavior. Normal judgment and thought content.     Assessment   33 y.o. G2P1001 at [redacted]w[redacted]d EDD 03/13/2020, by Last Menstrual Period presenting for a work-in prenatal visit.  Plan   pregnancy2 Problems (from 06/07/19 to present)    Problem Noted Resolved   Supervision of other normal pregnancy, antepartum 08/04/2019 by Rexene Agent, CNM No   Overview Addendum 08/16/2019  2:59 PM by  Gae Dry, MD    [place]  Dating LMP, [place] agrees Blood type:     Genetic Screen declines Antibody:   Anatomic [place]  Rubella:   Varicella:    GTT Early:           Third trimester:  RPR:     Rhogam  HBsAg:     TDaP vaccine                       Flu Shot: HIV:     Baby Food                                GBS:   Contraception  Pap: 03/26/2018, NLIM/HPV Negative  CBB     CS/VBAC    Support Person                 GAD-7 Score 19 PHQ-9 Score 17  Discussed treatment options for anxiety and depression, with strong recommendation that she begin medication for severe symptoms. She agreed to this plan. Advised about risks and side effects; need to take medication daily for effectiveness, time for therapeutic effect. Patient contracts for safety today. She does not want a referral to a therapist at this time. Asked her to call for a telephone or in-person visit with worsening symptoms; side effects. Assess next visit for improvement/changes.  Keep scheduled ROB next week.  Avel Sensor, CNM 09/03/2019  2:22 PM

## 2019-09-03 NOTE — Progress Notes (Signed)
ROB- very emotional due to personal issues

## 2019-09-08 ENCOUNTER — Encounter: Payer: 59 | Admitting: Obstetrics & Gynecology

## 2019-09-08 ENCOUNTER — Encounter: Payer: Self-pay | Admitting: Maternal Newborn

## 2019-09-08 DIAGNOSIS — F329 Major depressive disorder, single episode, unspecified: Secondary | ICD-10-CM | POA: Insufficient documentation

## 2019-09-08 DIAGNOSIS — F419 Anxiety disorder, unspecified: Secondary | ICD-10-CM | POA: Insufficient documentation

## 2019-09-08 DIAGNOSIS — O99340 Other mental disorders complicating pregnancy, unspecified trimester: Secondary | ICD-10-CM | POA: Insufficient documentation

## 2019-09-13 ENCOUNTER — Other Ambulatory Visit: Payer: 59

## 2019-09-13 ENCOUNTER — Encounter: Payer: 59 | Admitting: Advanced Practice Midwife

## 2019-09-13 ENCOUNTER — Other Ambulatory Visit: Payer: Self-pay

## 2019-09-13 DIAGNOSIS — R7309 Other abnormal glucose: Secondary | ICD-10-CM

## 2019-09-14 LAB — GESTATIONAL GLUCOSE TOLERANCE
Glucose, Fasting: 109 mg/dL — ABNORMAL HIGH (ref 65–94)
Glucose, GTT - 1 Hour: 227 mg/dL — ABNORMAL HIGH (ref 65–179)
Glucose, GTT - 2 Hour: 198 mg/dL — ABNORMAL HIGH (ref 65–154)
Glucose, GTT - 3 Hour: 120 mg/dL (ref 65–139)

## 2019-09-15 ENCOUNTER — Other Ambulatory Visit: Payer: Self-pay | Admitting: Maternal Newborn

## 2019-09-15 DIAGNOSIS — Z8632 Personal history of gestational diabetes: Secondary | ICD-10-CM | POA: Insufficient documentation

## 2019-09-15 DIAGNOSIS — O24419 Gestational diabetes mellitus in pregnancy, unspecified control: Secondary | ICD-10-CM

## 2019-09-15 MED ORDER — ACCU-CHEK NANO SMARTVIEW W/DEVICE KIT: 1.0000 | PACK | 0 refills | Status: DC

## 2019-09-15 MED ORDER — ACCU-CHEK SMARTVIEW VI STRP: ORAL_STRIP | 12 refills | Status: DC

## 2019-09-15 MED ORDER — ACCU-CHEK FASTCLIX LANCETS MISC: 1.0000 [IU] | Freq: Four times a day (QID) | 12 refills | Status: DC

## 2019-09-15 NOTE — Progress Notes (Signed)
Referral to [facility] and Rx sent for blood glucose testing supplies.

## 2019-09-22 ENCOUNTER — Encounter: Payer: Self-pay | Admitting: Advanced Practice Midwife

## 2019-09-22 ENCOUNTER — Other Ambulatory Visit: Payer: Self-pay

## 2019-09-22 ENCOUNTER — Ambulatory Visit (INDEPENDENT_AMBULATORY_CARE_PROVIDER_SITE_OTHER): Payer: 59 | Admitting: Advanced Practice Midwife

## 2019-09-22 VITALS — BP 118/74 | Wt 264.0 lb

## 2019-09-22 DIAGNOSIS — Z3A15 15 weeks gestation of pregnancy: Secondary | ICD-10-CM

## 2019-09-22 DIAGNOSIS — O0990 Supervision of high risk pregnancy, unspecified, unspecified trimester: Secondary | ICD-10-CM

## 2019-09-22 DIAGNOSIS — O0992 Supervision of high risk pregnancy, unspecified, second trimester: Secondary | ICD-10-CM

## 2019-09-22 NOTE — Patient Instructions (Signed)
Gestational Diabetes Mellitus, Self Care When you have gestational diabetes (gestational diabetes mellitus), you must make sure your blood sugar (glucose) stays in a healthy range. You can do this with:  Nutrition.  Exercise.  Lifestyle changes.  Medicines or insulin, if needed.  Support from your doctors and others. If you get treated for this condition, it may not hurt you or your unborn baby (fetus). If you do not get treated for this condition, it may cause problems that can hurt you or your unborn baby. If you get gestational diabetes, you are:  More likely to get it if you get pregnant again.  More likely to develop type 2 diabetes in the future. How to stay aware of blood sugar   Check your blood sugar every day while you are pregnant. Check it as often as told.  Call your doctor if your blood sugar is above your goal numbers for two tests in a row. Your doctor will set personal treatment goals for you. Generally, you should have these blood sugar levels:  Before meals, or after not eating for a long time (fasting or preprandial): at or below 95 mg/dL (5.3 mmol/L).  After meals (postprandial): ? One hour after a meal: at or below 140 mg/dL (7.8 mmol/L). ? Two hours after a meal: at or below 120 mg/dL (6.7 mmol/L).  A1c (hemoglobin A1c) level: 6-6.5%. How to manage high and low blood sugar Signs of high blood sugar High blood sugar is called hyperglycemia. Know the early signs of high blood sugar. Signs may include:  Feeling: ? Thirsty. ? Hungry. ? Very tired.  Needing to pee (urinate) more than usual.  Blurry vision. Signs of low blood sugar Low blood sugar is called hypoglycemia. This is when blood sugar is at or below 70 mg/dL (3.9 mmol/L). Signs may include:  Feeling: ? Hungry. ? Worried or nervous (anxious). ? Sweaty and clammy. ? Confused. ? Dizzy. ? Sleepy. ? Sick to your stomach (nauseous).  Having: ? A fast heartbeat. ? A headache. ? A change  in your vision. ? Tingling or no feeling (numbness) around your mouth, lips, or tongue. ? Jerky movements that you cannot control (seizure).  Having trouble with: ? Moving (coordination). ? Sleeping. ? Passing out (fainting). ? Getting upset easily (irritability). Treating low blood sugar To treat low blood sugar, eat or drink something sugary right away. If you can think clearly and swallow safely, follow the 15:15 rule:  Take 15 grams of a fast-acting carb (carbohydrate). Talk with your doctor about how much you should take.  Some fast-acting carbs are: ? Sugar tablets (glucose pills). Take 3-4 glucose pills. ? 6-8 pieces of hard candy. ? 4-6 oz (120-150 mL) of fruit juice. ? 4-6 oz (120-150 mL) of regular (not diet) soda. ? 1 Tbsp (15 mL) honey or sugar.  Check your blood sugar 15 minutes after you take the carb.  If your blood sugar is still at or below 70 mg/dL (3.9 mmol/L), take 15 grams of a carb again.  If your blood sugar does not go above 70 mg/dL (3.9 mmol/L) after 3 tries, get help right away.  After your blood sugar goes back to normal, eat a meal or a snack within 1 hour. Treating very low blood sugar If your blood sugar is at or below 54 mg/dL (3 mmol/L), you have very low blood sugar (severe hypoglycemia). This is an emergency. Do not wait to see if the symptoms will go away. Get medical help right  away. Call your local emergency services (911 in the [place] If you have very low blood sugar and you cannot eat or drink, you may need a glucagon shot (injection). A family member or friend should learn how to check your blood sugar and how to give you a glucagon shot. Ask your doctor if you need to have a glucagon shot kit at home. Follow these instructions at home: Medicine  Take your insulin and diabetes medicines as told.  If your doctor says you should take more or less insulin or medicines, do this exactly as told.  Do not run out of insulin or  medicines. Food   Make healthy food choices. These include: ? Chicken, fish, egg whites, and beans. ? Oats, whole wheat, bulgur, brown rice, quinoa, and millet. ? Fresh fruits and vegetables. ? Low-fat dairy products. ? Nuts, avocado, olive oil, and canola oil.  Meet with a food specialist (dietitian). He or she can help you make an eating plan that is right for you.  Follow instructions from your doctor about what you cannot eat or drink.  Drink enough fluid to keep your pee (urine) pale yellow.  Eat healthy snacks between healthy meals.  Keep track of carbs that you eat. Do this by reading food labels and learning food serving sizes.  Follow your sick day plan when you cannot eat or drink normally. Make this plan with your doctor so it is ready to use. Activity  Exercise for 30 or more minutes a day, or as much as your doctor recommends.  Talk with your doctor before you start a new exercise or activity. Your doctor may need to tell you to change: ? How much insulin or medicines you take. ? How much food you eat. Lifestyle  Do not drink alcohol.  Do not use any tobacco products. These include cigarettes, chewing tobacco, and e-cigarettes. If you need help quitting, ask your doctor.  Learn how to deal with stress. If you need help with this, ask your doctor. Body care  Stay up to date with your shots (immunizations).  Brush your teeth and gums two times a day. Floss one or more times a day.  Go to the dentist one or more times every 6 months.  Stay at a healthy weight while you are pregnant. General instructions  Take over-the-counter and prescription medicines only as told by your doctor.  Ask your doctor about risks of high blood pressure in pregnancy (preeclampsia and eclampsia).  Share your diabetes care plan with: ? Your work or school. ? People you live with.  Check your pee for ketones: ? When you are sick. ? As told by your doctor.  Carry a card or  wear jewelry that says you have diabetes.  Keep all follow-up visits as told by your doctor. This is important. Care after giving birth  Have your blood sugar checked 4-12 weeks after you give birth.  Get checked for diabetes one or more times during 3 years. Questions to ask your doctor  Do I need to meet with a diabetes educator?  Where can I find a support group for people with gestational diabetes? Where to find more information To learn more about diabetes, visit:  American Diabetes Association: www.diabetes.org  Centers for Disease Control and Prevention (CDC): www.cdc.gov Summary  Check your blood sugar (glucose) every day while you are pregnant. Check it as often as told.  Take your insulin and diabetes medicines as told.  Keep all follow-up visits as   told by your doctor. This is important.  Have your blood sugar checked 4-12 weeks after you give birth. This information is not intended to replace advice given to you by your health care provider. Make sure you discuss any questions you have with your health care provider. Document Released: 04/01/2016 Document Revised: 06/01/2018 Document Reviewed: 01/12/2016 Elsevier Patient Education  2020 Elsevier Inc.  

## 2019-09-22 NOTE — Progress Notes (Signed)
  Routine Prenatal Care Visit  Subjective  Karina Mason is a 33 y.o. G2P1001 at [redacted]w[redacted]d being seen today for ongoing prenatal care.  She is currently monitored for the following issues for this high-risk pregnancy and has Supervision of other normal pregnancy, antepartum; Anxiety during pregnancy, antepartum; Depression affecting pregnancy, antepartum; and Gestational diabetes mellitus (GDM), antepartum on their problem list.  ----------------------------------------------------------------------------------- Patient reports feeling well. She has not yet been seen at [facility]. She has not yet started checking her blood sugar since recent diagnosis of GDM. Discussed diet, exercise, using glucometer, keeping log.    . Vag. Bleeding: None.  Movement: Absent. Leaking Fluid denies.  ----------------------------------------------------------------------------------- The following portions of the patient's history were reviewed and updated as appropriate: allergies, current medications, past family history, past medical history, past social history, past surgical history and problem list. Problem list updated.  Objective  Blood pressure 118/74, weight 264 lb (119.7 kg), last menstrual period 06/07/2019. Pregravid weight 254 lb (115.2 kg) Total Weight Gain 10 lb (4.536 kg) Urinalysis: Urine Protein    Urine Glucose    Fetal Status: Fetal Heart Rate (bpm): 144   Movement: Absent     General:  Alert, oriented and cooperative. Patient is in no acute distress.  Skin: Skin is warm and dry. No rash noted.   Cardiovascular: Normal heart rate noted  Respiratory: Normal respiratory effort, no problems with respiration noted  Abdomen: Soft, gravid, appropriate for gestational age. Pain/Pressure: Absent     Pelvic:  Cervical exam deferred        Extremities: Normal range of motion.     Mental Status: Normal mood and affect. Normal behavior. Normal judgment and thought content.   Assessment   33 y.o.  G2P1001 at [redacted]w[redacted]d by  03/13/2020, by Last Menstrual Period presenting for routine prenatal visit  Plan   pregnancy2 Problems (from 06/07/19 to present)    Problem Noted Resolved   Supervision of other normal pregnancy, antepartum 08/04/2019 by Rexene Agent, CNM No   Overview Addendum 08/16/2019  2:59 PM by Gae Dry, MD    [facility] Prenatal Labs  Dating LMP, [place] agrees Blood type:     Genetic Screen declines Antibody:   Anatomic [place]  Rubella:   Varicella:    GTT Early:           Third trimester:  RPR:     Rhogam  HBsAg:     TDaP vaccine                       Flu Shot: HIV:     Baby Food                                GBS:   Contraception  Pap: 03/26/2018, NLIM/HPV Negative  CBB     CS/VBAC    Support Person                  Preterm labor symptoms and general obstetric precautions including but not limited to vaginal bleeding, contractions, leaking of fluid and fetal movement were reviewed in detail with the patient. Please refer to After Visit Summary for other counseling recommendations.   Return in about 4 weeks (around 10/20/2019) for anatomy scan and rob.  Rod Can, CNM 09/22/2019 2:53 PM

## 2019-09-22 NOTE — Progress Notes (Signed)
No vb. No lof.  

## 2019-10-12 ENCOUNTER — Ambulatory Visit: Payer: 59 | Admitting: Dietician

## 2019-10-18 ENCOUNTER — Encounter: Payer: 59 | Attending: Maternal Newborn | Admitting: *Deleted

## 2019-10-18 ENCOUNTER — Encounter: Payer: Self-pay | Admitting: *Deleted

## 2019-10-18 ENCOUNTER — Other Ambulatory Visit: Payer: Self-pay

## 2019-10-18 VITALS — BP 118/72 | Ht 70.0 in | Wt 264.1 lb

## 2019-10-18 DIAGNOSIS — O24410 Gestational diabetes mellitus in pregnancy, diet controlled: Secondary | ICD-10-CM

## 2019-10-18 DIAGNOSIS — O24419 Gestational diabetes mellitus in pregnancy, unspecified control: Secondary | ICD-10-CM | POA: Diagnosis present

## 2019-10-18 DIAGNOSIS — Z3A00 Weeks of gestation of pregnancy not specified: Secondary | ICD-10-CM | POA: Diagnosis not present

## 2019-10-18 NOTE — Progress Notes (Signed)
Diabetes Self-Management Education  Visit Type: First/Initial  Appt. Start Time: 1340 Appt. End Time: 3762  10/18/2019  Karina Mason, identified by name and date of birth, is a 33 y.o. female with a diagnosis of Diabetes: Gestational Diabetes.   ASSESSMENT  Blood pressure 118/72, height 5\' 10"  (1.778 m), weight 264 lb 1.6 oz (119.8 kg), last menstrual period 06/07/2019, estimated date of delivery 03/13/2020 Body mass index is 37.89 kg/m.  Diabetes Self-Management Education - 10/18/19 1715      Visit Information   Visit Type  First/Initial      Initial Visit   Diabetes Type  Gestational Diabetes    Are you currently following a meal plan?  Yes    What type of meal plan do you follow?  "I stopped eating carbs, sweets and drinking soda"    Are you taking your medications as prescribed?  Yes    Date Diagnosed  1 month ago      Health Coping   How would you rate your overall health?  Fair      Psychosocial Assessment   Patient Belief/Attitude about Diabetes  Other (comment)   "I'm always tired and my husband doesn't understand"   Self-care barriers  None    Self-management support  Doctor's office;Family    Patient Concerns  Nutrition/Meal planning;Glycemic Control;Weight Control;Healthy Lifestyle    Special Needs  None    Preferred Learning Style  Auditory;Visual;Hands on    [place] in progress    How often do you need to have someone help you when you read instructions, pamphlets, or other written materials from your doctor or pharmacy?  1 - Never    What is the last grade level you completed in school?  some college      Pre-Education Assessment   Patient understands the diabetes disease and treatment process.  Needs Instruction    Patient understands incorporating nutritional management into lifestyle.  Needs Instruction    Patient undertands incorporating physical activity into lifestyle.  Needs Instruction    Patient understands using  medications safely.  Needs Instruction    Patient understands monitoring blood glucose, interpreting and using results  Needs Instruction    Patient understands prevention, detection, and treatment of acute complications.  Needs Instruction    Patient understands prevention, detection, and treatment of chronic complications.  Needs Instruction    Patient understands how to develop strategies to address psychosocial issues.  Needs Instruction    Patient understands how to develop strategies to promote health/change behavior.  Needs Instruction      Complications   How often do you check your blood sugar?  0 times/day (not testing)   Pt was given a meter from MD office but not sure if this is preferred with her heatlh insurance. Provided One Touch Verio Flex and isntructed on use. BG upon return demonstration was 84 mg/dL at 3:15 pm - 2 hrs pp.   Have you had a dilated eye exam in the past 12 months?  No    Have you had a dental exam in the past 12 months?  Yes    Are you checking your feet?  Yes    How many days per week are you checking your feet?  7      Dietary Intake   Breakfast  eggs, bacon; occasional oatmeal    Snack (morning)  apples, grapes or peaches, skinny pop, crackers, cheese, Greek yogurt    Lunch  goes to [employer] cafeteria and has meat and 2 vegetables    Snack (afternoon)  same as am snack    Dinner  chicken, beef, pork with corn, brown, rice, occasional pasta, spinach    Snack (evening)  same as am snack    Beverage(s)  water, juice      Exercise   Exercise Type  ADL's      Patient Education   Previous Diabetes Education  No    Disease state   Definition of diabetes, type 1 and 2, and the diagnosis of diabetes;Factors that contribute to the development of diabetes    Nutrition management   Role of diet in the treatment of diabetes and the relationship between the three main macronutrients and blood glucose level;Food label reading, portion sizes and measuring  food.;Reviewed blood glucose goals for pre and post meals and how to evaluate the patients' food intake on their blood glucose level.    Physical activity and exercise   Role of exercise on diabetes management, blood pressure control and cardiac health.    Medications  Other (comment)   Limited use of oral medications during pregnancy and possible insulin   Monitoring  Taught/evaluated SMBG meter.;Purpose and frequency of SMBG.;Identified appropriate SMBG and/or A1C goals.;Ketone testing, when, how.    Chronic complications  Relationship between chronic complications and blood glucose control    Psychosocial adjustment  Identified and addressed patients feelings and concerns about diabetes    Preconception care  Pregnancy and GDM  Role of pre-pregnancy blood glucose control on the development of the fetus;Reviewed with patient blood glucose goals with pregnancy;Role of family planning for patients with diabetes      Individualized Goals (developed by patient)   Reducing Risk  Improve blood sugars Lose weight Lead a healthier lifestyle Become more fit     Outcomes   Expected Outcomes  Demonstrated interest in learning. Expect positive outcomes    Future DMSE  2 wks       Individualized Plan for Diabetes Self-Management Training:   Learning Objective:  Patient will have a greater understanding of diabetes self-management. Patient education plan is to attend individual and/or group sessions per assessed needs and concerns.   Plan:   Patient Instructions  Read booklet on Gestational Diabetes Follow Gestational Meal Planning Guidelines Avoid fruit juices Limit fried foods Complete a 3 Day Food Record and bring to next appointment Check blood sugars 4 x day - before breakfast and 2 hrs after every meal and record  Bring blood sugar log to all appointments Call MD for prescription for meter strips and lancets Strips   One Touch Verio Flex  Lancets   One Touch Delica Plus Purchase urine  ketone strips if ordered by MD and check urine ketones every am:  If + increase bedtime snack to 1 protein and 2 carbohydrate servings Walk 20-30 minutes at least 5 x week if permitted by MD  Expected Outcomes:  Demonstrated interest in learning. Expect positive outcomes  Education material provided:  Gestational Booklet Gestational Meal Planning Guidelines Simple Meal Plan Viewed Gestational Diabetes Video Meter = One Touch Verio Flex 3 Day Food Record Goals for a Healthy Pregnancy  If problems or questions, patient to contact team via:  Sharion Settler, RN, CCM, CDE [phone]  Future DSME appointment: 2 wks  November 01, 2019 with dietitian

## 2019-10-18 NOTE — Patient Instructions (Signed)
Read booklet on Gestational Diabetes Follow Gestational Meal Planning Guidelines Avoid fruit juices Limit fried foods Complete a 3 Day Food Record and bring to next appointment Check blood sugars 4 x day - before breakfast and 2 hrs after every meal and record  Bring blood sugar log to all appointments Call MD for prescription for meter strips and lancets Strips   One Touch Verio Flex  Lancets   One Touch Delica Plus Purchase urine ketone strips if ordered by MD and check urine ketones every am:  If + increase bedtime snack to 1 protein and 2 carbohydrate servings Walk 20-30 minutes at least 5 x week if permitted by MD

## 2019-10-20 ENCOUNTER — Other Ambulatory Visit: Payer: Self-pay

## 2019-10-20 ENCOUNTER — Ambulatory Visit (INDEPENDENT_AMBULATORY_CARE_PROVIDER_SITE_OTHER): Payer: 59 | Admitting: Obstetrics and Gynecology

## 2019-10-20 ENCOUNTER — Ambulatory Visit (INDEPENDENT_AMBULATORY_CARE_PROVIDER_SITE_OTHER): Payer: 59

## 2019-10-20 VITALS — BP 132/70 | Wt 264.0 lb

## 2019-10-20 DIAGNOSIS — F329 Major depressive disorder, single episode, unspecified: Secondary | ICD-10-CM

## 2019-10-20 DIAGNOSIS — Z3A19 19 weeks gestation of pregnancy: Secondary | ICD-10-CM

## 2019-10-20 DIAGNOSIS — O99340 Other mental disorders complicating pregnancy, unspecified trimester: Secondary | ICD-10-CM

## 2019-10-20 DIAGNOSIS — O0990 Supervision of high risk pregnancy, unspecified, unspecified trimester: Secondary | ICD-10-CM

## 2019-10-20 DIAGNOSIS — O24419 Gestational diabetes mellitus in pregnancy, unspecified control: Secondary | ICD-10-CM

## 2019-10-20 DIAGNOSIS — O24410 Gestational diabetes mellitus in pregnancy, diet controlled: Secondary | ICD-10-CM

## 2019-10-20 DIAGNOSIS — Z363 Encounter for antenatal screening for malformations: Secondary | ICD-10-CM | POA: Diagnosis not present

## 2019-10-20 DIAGNOSIS — O0992 Supervision of high risk pregnancy, unspecified, second trimester: Secondary | ICD-10-CM

## 2019-10-20 DIAGNOSIS — O99342 Other mental disorders complicating pregnancy, second trimester: Secondary | ICD-10-CM

## 2019-10-20 DIAGNOSIS — F32A Depression, unspecified: Secondary | ICD-10-CM

## 2019-10-20 NOTE — Progress Notes (Signed)
Routine Prenatal Care Visit  Subjective  Karina Mason is a 33 y.o. G2P1001 at [redacted]w[redacted]d being seen today for ongoing prenatal care.  She is currently monitored for the following issues for this high-risk pregnancy and has Supervision of high-risk pregnancy; Anxiety during pregnancy, antepartum; Depression affecting pregnancy, antepartum; and Gestational diabetes mellitus (GDM), antepartum on their problem list.  ----------------------------------------------------------------------------------- Patient reports no complaints.    .  .   . Denies leaking of fluid.  ----------------------------------------------------------------------------------- The following portions of the patient's history were reviewed and updated as appropriate: allergies, current medications, past family history, past medical history, past social history, past surgical history and problem list. Problem list updated.   Objective  Blood pressure 132/70, weight 264 lb (119.7 kg), last menstrual period 06/07/2019. Pregravid weight 254 lb (115.2 kg) Total Weight Gain 10 lb (4.536 kg) Urinalysis:      Fetal Status:           General:  Alert, oriented and cooperative. Patient is in no acute distress.  Skin: Skin is warm and dry. No rash noted.   Cardiovascular: Normal heart rate noted  Respiratory: Normal respiratory effort, no problems with respiration noted  Abdomen: Soft, gravid, appropriate for gestational age.       Pelvic:  Cervical exam deferred        Extremities: Normal range of motion.     ental Status: Normal mood and affect. Normal behavior. Normal judgment and thought content.   Date Fasting Breakfast Lunch Dinner  10/26   72   10/27 136 99  102  10/28 105       US Ob Comp + 14 Wk  Result Date: 10/20/2019 ULTRASOUND REPORT Location: [facility] Date of Service: 10/20/2019 Indications:Anatomy Ultrasound Findings: Singleton intrauterine pregnancy is visualized with FHR at 162 BPM. Biometrics  give an (U/S) Gestational age of [redacted]w[redacted]d and an (U/S) EDD of 03/08/2020; this correlates with the clinically established Estimated Date of Delivery: 03/13/20 Fetal presentation is Cephalic. EFW: 329 g ( 12 oz ). Placenta: posterior. Grade: 0 AFI: subjectively normal. Anatomic survey is complete and normal; Gender - female.  Impression: 1. [redacted]w[redacted]d Viable Singleton Intrauterine pregnancy by U/S. 2. (U/S) EDD is consistent with Clinically established Estimated Date of Delivery: 03/13/20 . 3. Normal Anatomy Scan Recommendations: 1.Clinical correlation with the patient's History and Physical Exam. Gweneth Dimitri, RT  There is a singleton gestation with subjectively normal amniotic fluid volume. The fetal biometry correlates with established dating. Detailed evaluation of the fetal anatomy was performed.The fetal anatomical survey appears within normal limits within the resolution of ultrasound as described above.  It must be noted that a normal ultrasound is unable to rule out fetal aneuploidy, subtle defects such as small ASD or VDS may also not be visible on imaging.  Malachy Mood, MD, [facility], [facility] 10/20/2019, 2:43 PM    Assessment   33 y.o. G2P1001 at [redacted]w[redacted]d by  03/13/2020, by Last Menstrual Period presenting for routine prenatal visit  Plan   pregnancy2 Problems (from 06/07/19 to present)    Problem Noted Resolved   Supervision of high-risk pregnancy 08/04/2019 by Rexene Agent, CNM No   Overview Addendum 08/16/2019  2:59 PM by Gae Dry, MD    Clinic [facility] Prenatal Labs  Dating LMP, [place] agrees Blood type:     Genetic Screen declines Antibody:   Anatomic [place]  Rubella:   Varicella:    GTT Early:  Third trimester:  RPR:     Rhogam  HBsAg:     TDaP vaccine                       Flu Shot: HIV:     Baby Food                                GBS:   Contraception  Pap: 03/26/2018, NLIM/HPV Negative  CBB     CS/VBAC    Support Person                   Gestational age appropriate obstetric precautions including but not limited to vaginal bleeding, contractions, leaking of fluid and fetal movement were reviewed in detail with the patient.    - Normal anatomy scan today - fetal echo ordered  Gestational diabetes  - Discussed ADA diet, in addition for obese patient diagnosed prior to 20 weeks recommend caloric restriction to 25 Kcal/kg/day ideal body weight.  Carbohydrates should be limited to 33-40% of daily caloric intake with attention given to high glycemic index vs low glycemic index foods.  The remaining calories should be split approximately 20% protein, 40% fats.  Three meals a day with two snacks to spread carbohdyrate load was recommended. - Recommend lifestyles referral for more in depth diet teaching - Home glucose monitoring 4 times a day (AM fasting goal <100, 2-hr postprandial <120).  Importance in maintaining glucose log discussed as proper management will rely on assessing home BG readings.  Stressed team approach with patient being a vital member of this team in ensuring best possible outcome for her fetus. - If good control twice weekly follow up appopriate - Antepartum testing indicated at 32-34 weeks if on meds for glycemic control, monthly growth scan starting at 28 weeks - For diet controlled gestational diabetes induction by 40 weeks if favorable cervix, delivery at latest by 41 weeks - Evidence of fetal macrosomia or poor control may necessitate earlier delivery, 39 weeks or less.  If fetal weight >4500g counseling regarding risk of shoulder dystocia and consideration of C-section discussed. - If pharmacotherapy should need to be started insulin remains the ADA recommended treatment.  Metformin may be used but this indication is not FDA approved, it readily crosses the placenta and while 2-year follow up data have not shown significant developmental differences long term follow up data is unavailable.  Glyburide may be used  as a second line oral agent but is not superior to insulin in preventing macrosomia, may be associated with increased rates of neonatal hypyoglycemia, and is not FDA approved for this indication.  - Stressed that diabetes is a common complicating factor in pregnancy affecting about 7% of pregnancies - Managed properly and with patient adherence to monitoring, diet and lifestyle changes does not have to be associated with adverse pregnancy outcome.  However, uncontrolled may lead to adverse outcomes including still birth, shoulder dystocia, need for cesarean delivery. - Increased risk of preeclampsia in patient diagnosed with gestational diabetes discussed - Increased lifetime risk of develop Type II diabetes discussed  ACOG Practive Bulletin 190 "Gestatioal Diabetes" Updated Februrary 2018   No follow-ups on file.  Vena Austria, MD, [facility], [facility] 10/20/2019, 2:43 PM

## 2019-10-20 NOTE — Progress Notes (Signed)
ROB/US No concerns Denies lof, no vb. some flutters  Declines flu

## 2019-10-26 ENCOUNTER — Encounter: Payer: Self-pay | Admitting: Obstetrics & Gynecology

## 2019-10-26 NOTE — Telephone Encounter (Signed)
Please advise 

## 2019-10-28 ENCOUNTER — Encounter: Payer: Self-pay | Admitting: Obstetrics & Gynecology

## 2019-10-29 ENCOUNTER — Other Ambulatory Visit: Payer: Self-pay | Admitting: Obstetrics & Gynecology

## 2019-11-01 ENCOUNTER — Encounter: Payer: Self-pay | Admitting: Dietician

## 2019-11-01 ENCOUNTER — Ambulatory Visit: Payer: 59 | Admitting: Dietician

## 2019-11-02 ENCOUNTER — Encounter: Payer: Self-pay | Admitting: Obstetrics & Gynecology

## 2019-11-03 ENCOUNTER — Encounter: Payer: Self-pay | Admitting: Obstetrics & Gynecology

## 2019-11-03 ENCOUNTER — Ambulatory Visit (INDEPENDENT_AMBULATORY_CARE_PROVIDER_SITE_OTHER): Payer: 59 | Admitting: Obstetrics & Gynecology

## 2019-11-03 ENCOUNTER — Other Ambulatory Visit: Payer: Self-pay

## 2019-11-03 VITALS — BP 130/80 | Wt 261.0 lb

## 2019-11-03 DIAGNOSIS — Z3A21 21 weeks gestation of pregnancy: Secondary | ICD-10-CM

## 2019-11-03 DIAGNOSIS — O24419 Gestational diabetes mellitus in pregnancy, unspecified control: Secondary | ICD-10-CM

## 2019-11-03 DIAGNOSIS — O24410 Gestational diabetes mellitus in pregnancy, diet controlled: Secondary | ICD-10-CM

## 2019-11-03 DIAGNOSIS — O0990 Supervision of high risk pregnancy, unspecified, unspecified trimester: Secondary | ICD-10-CM

## 2019-11-03 DIAGNOSIS — O0992 Supervision of high risk pregnancy, unspecified, second trimester: Secondary | ICD-10-CM

## 2019-11-03 LAB — POCT URINALYSIS DIPSTICK OB
Glucose, UA: NEGATIVE
POC,PROTEIN,UA: NEGATIVE

## 2019-11-03 NOTE — Addendum Note (Signed)
Addended by: Quintella Baton D on: 11/03/2019 02:35 PM   Modules accepted: Orders

## 2019-11-03 NOTE — Progress Notes (Signed)
  Subjective  Fetal Movement? yes Contractions? no Leaking Fluid? no Vaginal Bleeding? no Has not been keeping BS log Objective  BP 130/80   Wt 261 lb (118.4 kg)   LMP 06/07/2019 (Exact Date)   BMI 37.45 kg/m  General: NAD Pumonary: no increased work of breathing Abdomen: gravid, non-tender Extremities: no edema Psychiatric: mood appropriate, affect full  Assessment  33 y.o. G2P1001 at [redacted]w[redacted]d by  03/13/2020, by Last Menstrual Period presenting for routine prenatal visit  Plan   Problem List Items Addressed This Visit    Gestational diabetes mellitus (GDM), antepartum     Needs to keep BS log; will recheck in 2 weeks    See prior note, counseled again as to the importance of glycemic control in pregnancy    Plan Growth [place] 28 weeks    Consider APT based on control and meds use in third trimester   Supervision of high-risk pregnancy    PNV   [redacted] weeks gestation of pregnancy        pregnancy2 Problems (from 06/07/19 to present)    Problem Noted Resolved   Supervision of high-risk pregnancy 08/04/2019 by Rexene Agent, CNM No   Overview Addendum 11/03/2019  2:27 PM by Gae Dry, MD    Clinic [facility] Prenatal Labs  Dating LMP, [place] agrees Blood type: A/Positive/-- (08/24 1505)   Genetic Screen declines Antibody:Negative (08/24 1505)  Anatomic [place] 09/2019 nml Rubella: 1.36 (08/24 1505) Varicella:    GTT Early: 159, 3 hr GTT abn  RPR: Non Reactive (08/24 1505)   Rhogam A+ HBsAg: Negative (08/24 1505)   TDaP vaccine      p     Flu Shot:declines HIV: Non Reactive (08/24 1505)   Baby Food     Breast GBS: p  Contraception     IUD Pap: 03/26/2018, NLIM/HPV Negative  CBB  No   CS/VBAC n/a   Support Person Mom                 Barnett Applebaum, MD, [facility], [facility] 11/03/2019  2:27 PM

## 2019-11-04 ENCOUNTER — Telehealth: Payer: Self-pay

## 2019-11-04 NOTE — Telephone Encounter (Signed)
LVM for pt stating I spoke to [facility] this morning. They state they will call her either today or tomorrow to set up an appointment.

## 2019-11-05 NOTE — Telephone Encounter (Signed)
Pt is scheduled for [facility] in [place] for fetal echo on 11/23 @ 8:00am

## 2019-11-08 ENCOUNTER — Encounter: Payer: Self-pay | Admitting: Obstetrics & Gynecology

## 2019-11-08 NOTE — Progress Notes (Signed)
Patient rescheduled her appointment from 11/01/19 to 11/12/19.

## 2019-11-09 ENCOUNTER — Other Ambulatory Visit: Payer: Self-pay | Admitting: Maternal Newborn

## 2019-11-09 DIAGNOSIS — O0992 Supervision of high risk pregnancy, unspecified, second trimester: Secondary | ICD-10-CM

## 2019-11-09 MED ORDER — CONCEPT OB 130-92.4-1 MG PO CAPS: 1.0000 | ORAL_CAPSULE | Freq: Every day | ORAL | 6 refills | Status: AC

## 2019-11-09 NOTE — Telephone Encounter (Signed)
Can you send in a rx for the [place] prenatal?

## 2019-11-09 NOTE — Telephone Encounter (Signed)
I sent a refill for what she had on her chart, not sure if those are the right ones. If not, may need to see the bottle to get the right Rx.

## 2019-11-12 ENCOUNTER — Other Ambulatory Visit: Payer: Self-pay

## 2019-11-12 ENCOUNTER — Encounter: Payer: Self-pay | Admitting: Dietician

## 2019-11-12 ENCOUNTER — Encounter: Payer: 59 | Attending: Maternal Newborn | Admitting: Dietician

## 2019-11-12 VITALS — BP 120/70 | Ht 70.0 in | Wt 260.5 lb

## 2019-11-12 DIAGNOSIS — Z3A00 Weeks of gestation of pregnancy not specified: Secondary | ICD-10-CM | POA: Insufficient documentation

## 2019-11-12 DIAGNOSIS — O24419 Gestational diabetes mellitus in pregnancy, unspecified control: Secondary | ICD-10-CM | POA: Insufficient documentation

## 2019-11-12 DIAGNOSIS — O24410 Gestational diabetes mellitus in pregnancy, diet controlled: Secondary | ICD-10-CM

## 2019-11-12 NOTE — Patient Instructions (Signed)
   Try lower sugar options for fruit juices, such as Healthy Balance juice, Tropicana 50, or blend a small amount of juice with a sugar free drink like Crystal Light orange/ sunrise or Nature's Twist sugar free orangeade. Keep actual juice to 4oz or less and combine with a protein food.   Continue with eating your regular meals and snacks, great job!  Continue with exercise, movement during the day. This will help with blood sugar control and energy.   Test some blood sugars after meals, at least once a day. You can alternate which meal you check, or check after every meal especially if blood sugars are above the goal of 120.

## 2019-11-12 NOTE — Progress Notes (Signed)
.   Patient's BG record indicates fasting BGs ranging 94-132; no post-meal tests, as she has not yet obtained additional testing supplies. She was trying to make her sample supplies last so tested only fasting BGs.  . Patient's food diary indicates generally healthy food choices, and eating at regular intervals with 3 meals and 3-4 snacks daily. She is usually eating lunch out, but is making healthy choices.  . Provided 2000kcal meal plan, and provided sample menus. . Instructed patient on food safety, including avoidance of Listeriosis, and limiting mercury from fish. . Discussed importance of maintaining healthy lifestyle habits to reduce risk of Type 2 DM as well as Gestational DM with any future pregnancies. . Advised patient to use any remaining testing supplies to test some BGs after delivery, and to have BG tested ideally annually, as well as prior to attempting future pregnancies.

## 2019-11-16 ENCOUNTER — Encounter: Payer: Self-pay | Admitting: Certified Nurse Midwife

## 2019-11-16 ENCOUNTER — Ambulatory Visit (INDEPENDENT_AMBULATORY_CARE_PROVIDER_SITE_OTHER): Payer: 59 | Admitting: Certified Nurse Midwife

## 2019-11-16 ENCOUNTER — Other Ambulatory Visit: Payer: Self-pay

## 2019-11-16 VITALS — BP 110/60 | Wt 260.0 lb

## 2019-11-16 DIAGNOSIS — O0992 Supervision of high risk pregnancy, unspecified, second trimester: Secondary | ICD-10-CM

## 2019-11-16 DIAGNOSIS — Z3A23 23 weeks gestation of pregnancy: Secondary | ICD-10-CM

## 2019-11-16 DIAGNOSIS — O24419 Gestational diabetes mellitus in pregnancy, unspecified control: Secondary | ICD-10-CM

## 2019-11-16 NOTE — Progress Notes (Signed)
C/o very low constant abd pain; x2wks; hurts to bend to sit.rj

## 2019-11-17 NOTE — Progress Notes (Signed)
HROB at 23wk1d: GDM vs pregestational diabetes. Has been taking fasting CBGs for the past 10 days, but no 2hr pp.  The blood sugars seem to be trending down, but all fasting elevated but 1: range 94-132 11/10 125 11/11 119 11/12 112 11/13 132 11/14 120 11/15 98 11/16 97 11/17 94 11/18 95 11/19 97 Feeling good FM. FH 25cm/ FHTs WNL  Has fetal echo scheduled for tomorrow. Will schedule for EKG Discussed importance of checking CBGs qid (fasting and 2 hrs pp). Having some round ligament pains when getting up and down and when changing positions.-recommended maternity support garment ROB in 1 week with MD. TO bring glucose log  Dalia Heading, CNM

## 2019-11-22 ENCOUNTER — Telehealth: Payer: Self-pay | Admitting: Obstetrics and Gynecology

## 2019-11-22 NOTE — Telephone Encounter (Signed)
Patient is schedule for Thursday, 11/25/19 at [facility] at 11. Patient aware of location date and time

## 2019-11-22 NOTE — Telephone Encounter (Signed)
-----   Message from [place] sent at 11/22/2019 10:24 AM EST ----- Regarding: FW: Schedule EKG Please schedule the patient's EKG. Thanks. ----- Message ----- From: Dalia Heading, CNM Sent: 11/17/2019   9:56 PM EST To: [place] Lodge Subject: Schedule EKG                                   Please schedule EKG for this patient who is [redacted] weeks pregnant with gestational diabetes. Thanks, [employer]

## 2019-11-25 ENCOUNTER — Ambulatory Visit
Admission: RE | Admit: 2019-11-25 | Discharge: 2019-11-25 | Disposition: A | Discharge status: Home / Self Care | Payer: 59 | Source: Ambulatory Visit | Attending: Certified Nurse Midwife | Admitting: Certified Nurse Midwife

## 2019-11-25 ENCOUNTER — Other Ambulatory Visit: Payer: Self-pay

## 2019-11-25 DIAGNOSIS — O24419 Gestational diabetes mellitus in pregnancy, unspecified control: Secondary | ICD-10-CM | POA: Insufficient documentation

## 2019-11-25 DIAGNOSIS — Z3A00 Weeks of gestation of pregnancy not specified: Secondary | ICD-10-CM | POA: Insufficient documentation

## 2019-11-26 ENCOUNTER — Encounter: Payer: 59 | Admitting: Obstetrics and Gynecology

## 2019-12-20 ENCOUNTER — Encounter: Payer: 59 | Admitting: Obstetrics & Gynecology

## 2019-12-24 NOTE — L&D Delivery Note (Signed)
Delivery Note Primary OB: [facility] Delivery Physician: Annamarie Major, MD Gestational Age: Full term Antepartum complications: gestational diabetes Intrapartum complications: None  A viable Female was delivered via vertex presentation. "Karina Mason" Apgars:8 ,9  Weight:  pending .   Placenta status: spontaneous and Intact.  Cord: 3+ vessels;  with the following complications: none.  Anesthesia:  epidural Episiotomy:  none Lacerations:  1st Suture Repair: 2.0 vicryl none Est. Blood Loss (mL):  400 mL  Mom to postpartum.  Baby to Couplet care / Skin to Skin.  Annamarie Major, MD, [facility], [facility] 02/29/2020  4:45 PM [phone]

## 2019-12-29 NOTE — Telephone Encounter (Signed)
Can you call and work out appointments with pt, not sure when she was seen last, or what all she has missed

## 2020-01-03 ENCOUNTER — Encounter: Payer: 59 | Admitting: Obstetrics and Gynecology

## 2020-01-07 ENCOUNTER — Telehealth: Payer: Self-pay

## 2020-01-07 NOTE — Telephone Encounter (Signed)
Voicemail left for pt to call and update [place] on her status. She has missed several appointments here at [facility]. She is HROB and has not been seen by our providers since 10/2019

## 2020-01-11 NOTE — Telephone Encounter (Signed)
I have left another voicemail for pt to call us back and give [place] an update. She has not been seen here at [facility] in quite sometime, and has not shown up for MFM appointments. KJ CMA

## 2020-01-24 ENCOUNTER — Other Ambulatory Visit: Payer: Self-pay

## 2020-01-24 ENCOUNTER — Ambulatory Visit (INDEPENDENT_AMBULATORY_CARE_PROVIDER_SITE_OTHER): Payer: 59 | Admitting: Certified Nurse Midwife

## 2020-01-24 DIAGNOSIS — O24313 Unspecified pre-existing diabetes mellitus in pregnancy, third trimester: Secondary | ICD-10-CM

## 2020-01-24 DIAGNOSIS — O0993 Supervision of high risk pregnancy, unspecified, third trimester: Secondary | ICD-10-CM

## 2020-01-24 DIAGNOSIS — O0992 Supervision of high risk pregnancy, unspecified, second trimester: Secondary | ICD-10-CM

## 2020-01-24 DIAGNOSIS — O24319 Unspecified pre-existing diabetes mellitus in pregnancy, unspecified trimester: Secondary | ICD-10-CM

## 2020-01-24 DIAGNOSIS — Z3A33 33 weeks gestation of pregnancy: Secondary | ICD-10-CM

## 2020-01-24 LAB — POCT URINALYSIS DIPSTICK OB: Glucose, UA: NEGATIVE

## 2020-01-24 LAB — GLUCOSE, POCT (MANUAL RESULT ENTRY): POC Glucose: 166 mg/dl — AB (ref 70–99)

## 2020-01-24 NOTE — Progress Notes (Signed)
HROB at [redacted] weeks gestation with either early GDM vs pregestational diabetes: Has not been going to appointments for the last 2 months.Is currently living in a motel because her home needs new electrical wiring. Has no electricity at home. Has stopped checking blood sugars in the last 2 weeks. Reports that her fasting blood sugars prior to that were in the 120s-130s. Did have the fetal echo done which was normal. Baby has been active. Having increased pelvic pressure. Denies contractions, vaginal bleeding, leakage of water.   Random CBG=166 (one hour pp). FHTs 141 FH 38cm  A/P: IUP at 33 weeks with early GDM vs pregestational diabetes-noncompliant with care and probably not well controlled on diet. S>D Explained again increased risk for stillbirth with uncontrolled blood sugars. Also explained importance of taking blood sugars and keeping log to help determine whether she needs to be on medication and help determine the dose. Given more paper logs and reminded to take blood sugars qid Return ASAP for growth scan/ AFI/ NST Hemoglobin A1C, CBC, RPR, HIV today Needs TDAP at next visit. Will send another consult for DP for 1-2 weeks when she has checked some CBGs Farrel Conners, CNM

## 2020-01-24 NOTE — Progress Notes (Signed)
Pt hasn't ck'd blood sugars in two weeks; not in her home; has a lot going on; pelvic pain/stiffness.rj

## 2020-01-25 LAB — CBC WITH DIFFERENTIAL/PLATELET
Basophils Absolute: 0 10*3/uL (ref 0.0–0.2)
Basos: 0 %
EOS (ABSOLUTE): 0.1 10*3/uL (ref 0.0–0.4)
Eos: 1 %
Hematocrit: 35.5 % (ref 34.0–46.6)
Hemoglobin: 12.3 g/dL (ref 11.1–15.9)
Immature Grans (Abs): 0.1 10*3/uL (ref 0.0–0.1)
Immature Granulocytes: 1 %
Lymphocytes Absolute: 2.2 10*3/uL (ref 0.7–3.1)
Lymphs: 25 %
MCH: 31.5 pg (ref 26.6–33.0)
MCHC: 34.6 g/dL (ref 31.5–35.7)
MCV: 91 fL (ref 79–97)
Monocytes Absolute: 0.4 10*3/uL (ref 0.1–0.9)
Monocytes: 4 %
Neutrophils Absolute: 6.2 10*3/uL (ref 1.4–7.0)
Neutrophils: 69 %
Platelets: 242 10*3/uL (ref 150–450)
RBC: 3.91 x10E6/uL (ref 3.77–5.28)
RDW: 13.3 % (ref 11.7–15.4)
WBC: 9 10*3/uL (ref 3.4–10.8)

## 2020-01-25 LAB — HIV ANTIBODY (ROUTINE TESTING W REFLEX): HIV Screen 4th Generation wRfx: NONREACTIVE

## 2020-01-25 LAB — RPR QUALITATIVE: RPR Ser Ql: NONREACTIVE

## 2020-01-25 LAB — HEMOGLOBIN A1C
Est. average glucose Bld gHb Est-mCnc: 123 mg/dL
Hgb A1c MFr Bld: 5.9 % — ABNORMAL HIGH (ref 4.8–5.6)

## 2020-01-26 ENCOUNTER — Ambulatory Visit (INDEPENDENT_AMBULATORY_CARE_PROVIDER_SITE_OTHER): Payer: 59 | Admitting: Obstetrics and Gynecology

## 2020-01-26 ENCOUNTER — Other Ambulatory Visit: Payer: Self-pay

## 2020-01-26 ENCOUNTER — Ambulatory Visit (INDEPENDENT_AMBULATORY_CARE_PROVIDER_SITE_OTHER): Payer: 59

## 2020-01-26 ENCOUNTER — Encounter: Payer: Self-pay | Admitting: Obstetrics and Gynecology

## 2020-01-26 VITALS — BP 112/69 | Wt 265.0 lb

## 2020-01-26 DIAGNOSIS — F32A Depression, unspecified: Secondary | ICD-10-CM

## 2020-01-26 DIAGNOSIS — O24410 Gestational diabetes mellitus in pregnancy, diet controlled: Secondary | ICD-10-CM | POA: Diagnosis not present

## 2020-01-26 DIAGNOSIS — O0992 Supervision of high risk pregnancy, unspecified, second trimester: Secondary | ICD-10-CM

## 2020-01-26 DIAGNOSIS — Z3A33 33 weeks gestation of pregnancy: Secondary | ICD-10-CM

## 2020-01-26 DIAGNOSIS — O99343 Other mental disorders complicating pregnancy, third trimester: Secondary | ICD-10-CM

## 2020-01-26 DIAGNOSIS — O24313 Unspecified pre-existing diabetes mellitus in pregnancy, third trimester: Secondary | ICD-10-CM

## 2020-01-26 DIAGNOSIS — O0993 Supervision of high risk pregnancy, unspecified, third trimester: Secondary | ICD-10-CM

## 2020-01-26 DIAGNOSIS — Z3A35 35 weeks gestation of pregnancy: Secondary | ICD-10-CM

## 2020-01-26 DIAGNOSIS — F419 Anxiety disorder, unspecified: Secondary | ICD-10-CM

## 2020-01-26 DIAGNOSIS — O24319 Unspecified pre-existing diabetes mellitus in pregnancy, unspecified trimester: Secondary | ICD-10-CM

## 2020-01-26 DIAGNOSIS — F329 Major depressive disorder, single episode, unspecified: Secondary | ICD-10-CM

## 2020-01-26 DIAGNOSIS — O99340 Other mental disorders complicating pregnancy, unspecified trimester: Secondary | ICD-10-CM

## 2020-01-26 LAB — FETAL NONSTRESS TEST

## 2020-01-26 LAB — POCT URINALYSIS DIPSTICK OB
Glucose, UA: NEGATIVE
POC,PROTEIN,UA: NEGATIVE

## 2020-01-26 NOTE — Telephone Encounter (Signed)
Can you find out what meter she has and make sure she has the right strips called in?  thanks

## 2020-01-26 NOTE — Progress Notes (Signed)
Routine Prenatal Care Visit  Subjective  Karina Mason is a 34 y.o. G2P1001 at [redacted]w[redacted]d being seen today for ongoing prenatal care.  She is currently monitored for the following issues for this high-risk pregnancy and has Supervision of high-risk pregnancy; Anxiety during pregnancy, antepartum; Depression affecting pregnancy, antepartum; and Gestational diabetes mellitus (GDM), antepartum on their problem list.  ----------------------------------------------------------------------------------- Patient reports no complaints.   Contractions: Not present. Vag. Bleeding: None.  Movement: Present. Leaking Fluid denies.  Growth u/s shows baby at 74th %ile, AFI 16.4 cm GDM: after clinic she sends in her blood glucose log showing 2/3 elevated fasting and mostly normal 2 hour postprandial. She did have a high of 166.  However, she states that she has changed her diet and instead of eating out, she is cooking microwave meals with low carbs.   ----------------------------------------------------------------------------------- The following portions of the patient's history were reviewed and updated as appropriate: allergies, current medications, past family history, past medical history, past social history, past surgical history and problem list. Problem list updated.  Objective  Blood pressure 112/69, weight 265 lb (120.2 kg), last menstrual period 06/07/2019. Pregravid weight 254 lb (115.2 kg) Total Weight Gain 11 lb (4.99 kg) Urinalysis: Urine Protein Negative  Urine Glucose Negative  Fetal Status: Fetal Heart Rate (bpm): 140   Movement: Present  Presentation: Vertex  General:  Alert, oriented and cooperative. Patient is in no acute distress.  Skin: Skin is warm and dry. No rash noted.   Cardiovascular: Normal heart rate noted  Respiratory: Normal respiratory effort, no problems with respiration noted  Abdomen: Soft, gravid, appropriate for gestational age. Pain/Pressure: Present     Pelvic:   Cervical exam deferred        Extremities: Normal range of motion.     Mental Status: Normal mood and affect. Normal behavior. Normal judgment and thought content.   NST: Baseline FHR: 140 beats/min Variability: moderate Accelerations: present Decelerations: absent Tocometry: not done  Interpretation:  INDICATIONS: diabetes mellitus RESULTS:  A NST procedure was performed with FHR monitoring and a normal baseline established, appropriate time of 20-40 minutes of evaluation, and accels >2 seen w 15x15 characteristics.  Results show a REACTIVE NST.    Imaging Results US OB Follow Up  Result Date: 01/26/2020 Patient Name: Karina Mason DOB: August 10, 1986 MRN: [id number] ULTRASOUND REPORT Location: [facility] Date of Service: 01/26/2020 Indications:growth/afi Findings: [name] intrauterine pregnancy is visualized with FHR at 134 BPM. Biometrics give an (U/S) Gestational age of [redacted]w[redacted]d and an (U/S) EDD of 03/01/2020; this correlates with the clinically established Estimated Date of Delivery: 03/13/2020. Fetal presentation is Cephalic. Placenta: posterior. Grade: 3 AFI: 16.4 cm Growth percentile is 73.7%.  AC percentile is 93.2%. EFW: 2,595 g  (5 lb 12 oz) Impression: 1. [redacted]w[redacted]d Viable Singleton Intrauterine pregnancy previously established criteria. 2. Growth is 73.7 %ile.  AFI is 16.4 cm. Deanna Artis, RT The ultrasound images and findings were reviewed by me and I agree with the above report. Thomasene Mohair, MD, [facility], [facility] 01/26/2020 10:56 AM      Assessment   33 y.o. G2P1001 at [redacted]w[redacted]d by  03/13/2020, by Last Menstrual Period presenting for routine prenatal visit  Plan   pregnancy2 Problems (from 06/07/19 to present)    Problem Noted Resolved   Supervision of high-risk pregnancy 08/04/2019 by Oswaldo Conroy, CNM No   Overview Addendum 11/17/2019  9:54 PM by Farrel Conners, CNM    Clinic [facility] Prenatal Labs  Dating  LMP, [place] agrees Blood type:  A/Positive/-- (08/24 1505)   Genetic Screen declines Antibody:Negative (08/24 1505)  Anatomic [place] 09/2019 nml Rubella: 1.36 (08/24 1505) Varicella:    GTT Early: 159, 3 hr GTT abn : 109/227/198/120 RPR: Non Reactive (08/24 1505)   Rhogam A+ HBsAg: Negative (08/24 1505)   TDaP vaccine           Flu Shot:declines HIV: Non Reactive (08/24 1505)   Baby Food     Breast GBS:   Contraception     IUD Pap: 03/26/2018, NLIM/HPV Negative  CBB  No   CS/VBAC n/a   Support Person Mom                 Preterm labor symptoms and general obstetric precautions including but not limited to vaginal bleeding, contractions, leaking of fluid and fetal movement were reviewed in detail with the patient. Please refer to After Visit Summary for other counseling recommendations.   - will hold on starting medication for today. She has started doing a much better job of collecting her BG log. Will see how she is doing in 5 days and determine whether she needs to start a medication at that point.  Return in about 5 days (around 01/31/2020) for ROB/NST.  Prentice Docker, MD, [facility], [facility] 01/26/2020 4:08 PM

## 2020-02-02 ENCOUNTER — Ambulatory Visit (INDEPENDENT_AMBULATORY_CARE_PROVIDER_SITE_OTHER): Payer: 59 | Admitting: Obstetrics and Gynecology

## 2020-02-02 ENCOUNTER — Encounter: Payer: Self-pay | Admitting: Obstetrics and Gynecology

## 2020-02-02 ENCOUNTER — Other Ambulatory Visit: Payer: Self-pay

## 2020-02-02 VITALS — BP 124/74 | Wt 268.0 lb

## 2020-02-02 DIAGNOSIS — O99343 Other mental disorders complicating pregnancy, third trimester: Secondary | ICD-10-CM

## 2020-02-02 DIAGNOSIS — O09893 Supervision of other high risk pregnancies, third trimester: Secondary | ICD-10-CM

## 2020-02-02 DIAGNOSIS — O0993 Supervision of high risk pregnancy, unspecified, third trimester: Secondary | ICD-10-CM

## 2020-02-02 DIAGNOSIS — F419 Anxiety disorder, unspecified: Secondary | ICD-10-CM

## 2020-02-02 DIAGNOSIS — O24410 Gestational diabetes mellitus in pregnancy, diet controlled: Secondary | ICD-10-CM

## 2020-02-02 DIAGNOSIS — Z3A34 34 weeks gestation of pregnancy: Secondary | ICD-10-CM | POA: Diagnosis not present

## 2020-02-02 DIAGNOSIS — F329 Major depressive disorder, single episode, unspecified: Secondary | ICD-10-CM

## 2020-02-02 DIAGNOSIS — O99340 Other mental disorders complicating pregnancy, unspecified trimester: Secondary | ICD-10-CM

## 2020-02-02 NOTE — Progress Notes (Addendum)
Routine Prenatal Care Visit  Subjective  Karina Mason is a 34 y.o. G2P1001 at [redacted]w[redacted]d being seen today for ongoing prenatal care.  She is currently monitored for the following issues for this high-risk pregnancy and has Supervision of high-risk pregnancy; Anxiety during pregnancy, antepartum; Depression affecting pregnancy, antepartum; and Gestational diabetes mellitus (GDM), antepartum on their problem list.  ----------------------------------------------------------------------------------- Patient reports no complaints.   Contractions: Not present. Vag. Bleeding: None.  Movement: Present. Leaking Fluid denies.  GDM: patient brings BG log. Most fasting values are normal. Occasional elevations of 2h pp breakfast, normal 2h pp lunch. Most 2h pp dinner elevated. ----------------------------------------------------------------------------------- The following portions of the patient's history were reviewed and updated as appropriate: allergies, current medications, past family history, past medical history, past social history, past surgical history and problem list. Problem list updated.  Objective  Blood pressure 124/74, weight 268 lb (121.6 kg), last menstrual period 06/07/2019. Pregravid weight 254 lb (115.2 kg) Total Weight Gain 14 lb (6.35 kg) Urinalysis: Urine Protein    Urine Glucose    Fetal Status: Fetal Heart Rate (bpm): 135   Movement: Present     General:  Alert, oriented and cooperative. Patient is in no acute distress.  Skin: Skin is warm and dry. No rash noted.   Cardiovascular: Normal heart rate noted  Respiratory: Normal respiratory effort, no problems with respiration noted  Abdomen: Soft, gravid, appropriate for gestational age. Pain/Pressure: Present     Pelvic:  Cervical exam deferred        Extremities: Normal range of motion.     Mental Status: Normal mood and affect. Normal behavior. Normal judgment and thought content.   NST: Baseline FHR: 135  beats/min Variability: moderate Accelerations: present Decelerations: absent Tocometry: not done  Interpretation:  INDICATIONS: gestational diabetes mellitus RESULTS:  A NST procedure was performed with FHR monitoring and a normal baseline established, appropriate time of 20-40 minutes of evaluation, and accels >2 seen w 15x15 characteristics.  Results show a REACTIVE NST.    Assessment   34 y.o. G2P1001 at [redacted]w[redacted]d by  03/13/2020, by Last Menstrual Period presenting for routine prenatal visit  Plan   pregnancy2 Problems (from 06/07/19 to present)    Problem Noted Resolved   Supervision of high-risk pregnancy 08/04/2019 by Rexene Agent, CNM No   Overview Addendum 11/17/2019  9:54 PM by Dalia Heading, [place] Prenatal Labs  Dating LMP, [place] agrees Blood type: A/Positive/-- (08/24 1505)   Genetic Screen declines Antibody:Negative (08/24 1505)  Anatomic [place] 09/2019 nml Rubella: 1.36 (08/24 1505) Varicella:    GTT Early: 159, 3 hr GTT abn : 109/227/198/120 RPR: Non Reactive (08/24 1505)   Rhogam A+ HBsAg: Negative (08/24 1505)   TDaP vaccine           Flu Shot:declines HIV: Non Reactive (08/24 1505)   Baby Food     Breast GBS:   Contraception     IUD Pap: 03/26/2018, NLIM/HPV Negative  CBB  No   CS/VBAC n/a   Support Person Mom                 Preterm labor symptoms and general obstetric precautions including but not limited to vaginal bleeding, contractions, leaking of fluid and fetal movement were reviewed in detail with the patient. Please refer to After Visit Summary for other counseling recommendations.   - follow up 5 days. If BG not improved, will start medication  Return in about 5 days (around 02/07/2020) for U/S  for AFI with routine prenatal and NST.  Thomasene Mohair, MD, [facility], [facility] 02/02/2020 10:38 AM

## 2020-02-07 ENCOUNTER — Ambulatory Visit (INDEPENDENT_AMBULATORY_CARE_PROVIDER_SITE_OTHER): Payer: 59

## 2020-02-07 ENCOUNTER — Ambulatory Visit (INDEPENDENT_AMBULATORY_CARE_PROVIDER_SITE_OTHER): Payer: 59 | Admitting: Obstetrics and Gynecology

## 2020-02-07 ENCOUNTER — Other Ambulatory Visit: Payer: Self-pay

## 2020-02-07 VITALS — BP 112/66 | Wt 265.0 lb

## 2020-02-07 DIAGNOSIS — O0993 Supervision of high risk pregnancy, unspecified, third trimester: Secondary | ICD-10-CM

## 2020-02-07 DIAGNOSIS — Z3A35 35 weeks gestation of pregnancy: Secondary | ICD-10-CM

## 2020-02-07 DIAGNOSIS — O24410 Gestational diabetes mellitus in pregnancy, diet controlled: Secondary | ICD-10-CM

## 2020-02-07 DIAGNOSIS — O99213 Obesity complicating pregnancy, third trimester: Secondary | ICD-10-CM

## 2020-02-07 DIAGNOSIS — O99210 Obesity complicating pregnancy, unspecified trimester: Secondary | ICD-10-CM | POA: Insufficient documentation

## 2020-02-07 HISTORY — DX: Obesity complicating pregnancy, unspecified trimester: O99.210

## 2020-02-07 NOTE — Progress Notes (Signed)
ROB AFI/NST 

## 2020-02-07 NOTE — Progress Notes (Signed)
Routine Prenatal Care Visit  Subjective  Karina Mason is a 34 y.o. G2P1001 at [redacted]w[redacted]d being seen today for ongoing prenatal care.  She is currently monitored for the following issues for this high-risk pregnancy and has Supervision of high-risk pregnancy; Anxiety during pregnancy, antepartum; Depression affecting pregnancy, antepartum; Gestational diabetes mellitus (GDM), antepartum; and Obesity affecting pregnancy, antepartum on their problem list.  ----------------------------------------------------------------------------------- Patient reports no complaints.   Contractions: Not present. Vag. Bleeding: None.  Movement: Present. Denies leaking of fluid.  ----------------------------------------------------------------------------------- The following portions of the patient's history were reviewed and updated as appropriate: allergies, current medications, past family history, past medical history, past social history, past surgical history and problem list. Problem list updated.   Objective  Blood pressure 112/66, weight 265 lb (120.2 kg), last menstrual period 06/07/2019. Pregravid weight 254 lb (115.2 kg) Total Weight Gain 11 lb (4.99 kg)  Body mass index is 38.02 kg/m.  Urinalysis:      Fetal Status: Fetal Heart Rate (bpm): 140   Movement: Present  Presentation: Vertex  General:  Alert, oriented and cooperative. Patient is in no acute distress.  Skin: Skin is warm and dry. No rash noted.   Cardiovascular: Normal heart rate noted  Respiratory: Normal respiratory effort, no problems with respiration noted  Abdomen: Soft, gravid, appropriate for gestational age. Pain/Pressure: Present     Pelvic:  Cervical exam deferred        Extremities: Normal range of motion.     ental Status: Normal mood and affect. Normal behavior. Normal judgment and thought content.   US OB Limited  Result Date: 02/07/2020 Patient Name: Karina Mason DOB: 1986-02-26 MRN: [id number] ULTRASOUND  REPORT Location: [facility] Date of Service: 02/07/2020 Indications:AFI Findings: [name] intrauterine pregnancy is visualized with FHR at 144 BPM. Fetal presentation is Cephalic. Placenta: posterior. Grade: 2 AFI: 18.7 cm Impression: 1. [redacted]w[redacted]d Viable Singleton Intrauterine pregnancy dated by previously established criteria. 2. AFI is 18.7 cm. Recommendations: 1.Clinical correlation with the patient's History and Physical Exam. Deanna Artis, RT There is a singleton gestation with normal amniotic fluid volume. The visualized fetal anatomical survey appears within normal limits within the resolution of ultrasound as described above.  It must be noted that a normal ultrasound is unable to rule out fetal aneuploidy.  Vena Austria, MD, [facility], [facility] 02/07/2020, 3:58 PM   US OB Follow Up  Result Date: 01/26/2020 Patient Name: Karina Mason DOB: [birth year] MRN: [id number] ULTRASOUND REPORT Location: [facility] Date of Service: 01/26/2020 Indications:growth/afi Findings: [name] intrauterine pregnancy is visualized with FHR at 134 BPM. Biometrics give an (U/S) Gestational age of [redacted]w[redacted]d and an (U/S) EDD of 03/01/2020; this correlates with the clinically established Estimated Date of Delivery: 03/13/2020. Fetal presentation is Cephalic. Placenta: posterior. Grade: 3 AFI: 16.4 cm Growth percentile is 73.7%.  AC percentile is 93.2%. EFW: 2,595 g  (5 lb 12 oz) Impression: 1. [redacted]w[redacted]d Viable Singleton Intrauterine pregnancy previously established criteria. 2. Growth is 73.7 %ile.  AFI is 16.4 cm. Deanna Artis, RT The ultrasound images and findings were reviewed by me and I agree with the above report. Thomasene Mohair, MD, [facility], [facility] 01/26/2020 10:56 AM    Baseline: 140 Variability: moderate Accelerations: present Decelerations: absent Tocometry: N/A The patient was monitored for 30 minutes, fetal heart rate tracing was deemed  reactive, category I tracing,  Date Fasting Breakfast Lunch Dinner  2/11 97 119 120 119  2/12 128  125 114 121  2/13 95 99    2/14 108 94 160 141  2/15 93 98      Assessment   34 y.o. G2P1001 at [redacted]w[redacted]d by  03/13/2020, by Last Menstrual Period presenting for routine prenatal visit  Plan   pregnancy2 Problems (from 06/07/19 to present)    Problem Noted Resolved   Supervision of high-risk pregnancy 08/04/2019 by Rexene Agent, CNM No   Overview Addendum 11/17/2019  9:54 PM by Dalia Heading, [place] Prenatal Labs  Dating LMP, [place] agrees Blood type: A/Positive/-- (08/24 1505)   Genetic Screen declines Antibody:Negative (08/24 1505)  Anatomic [place] 09/2019 nml Rubella: 1.36 (08/24 1505) Varicella:    GTT Early: 159, 3 hr GTT abn : 109/227/198/120 RPR: Non Reactive (08/24 1505)   Rhogam A+ HBsAg: Negative (08/24 1505)   TDaP vaccine           Flu Shot:declines HIV: Non Reactive (08/24 1505)   Baby Food     Breast GBS:   Contraception     IUD Pap: 03/26/2018, NLIM/HPV Negative  CBB  No   CS/VBAC n/a   Support Person Mom                 Gestational age appropriate obstetric precautions including but not limited to vaginal bleeding, contractions, leaking of fluid and fetal movement were reviewed in detail with the patient.    - NST reactive - Growth scan next visit - BG log reviewed, insufficient amount of reading to make suggestion as to starting medication.  Will review additional values next visit  Return in about 1 week (around 02/14/2020) for ROB, NST, growth scan any MD.  Malachy Mood, MD, [place] 02/07/2020, 3:59 PM

## 2020-02-14 ENCOUNTER — Encounter: Payer: Self-pay | Admitting: Obstetrics and Gynecology

## 2020-02-14 ENCOUNTER — Other Ambulatory Visit: Payer: Self-pay

## 2020-02-14 ENCOUNTER — Ambulatory Visit (INDEPENDENT_AMBULATORY_CARE_PROVIDER_SITE_OTHER): Payer: 59

## 2020-02-14 ENCOUNTER — Ambulatory Visit (INDEPENDENT_AMBULATORY_CARE_PROVIDER_SITE_OTHER): Payer: 59 | Admitting: Obstetrics and Gynecology

## 2020-02-14 ENCOUNTER — Other Ambulatory Visit (HOSPITAL_COMMUNITY)
Admission: RE | Admit: 2020-02-14 | Discharge: 2020-02-14 | Disposition: A | Discharge status: Home / Self Care | Payer: 59 | Source: Ambulatory Visit | Attending: Obstetrics and Gynecology | Admitting: Obstetrics and Gynecology

## 2020-02-14 VITALS — BP 110/68 | Wt 266.0 lb

## 2020-02-14 DIAGNOSIS — O24410 Gestational diabetes mellitus in pregnancy, diet controlled: Secondary | ICD-10-CM | POA: Diagnosis not present

## 2020-02-14 DIAGNOSIS — Z362 Encounter for other antenatal screening follow-up: Secondary | ICD-10-CM

## 2020-02-14 DIAGNOSIS — O99213 Obesity complicating pregnancy, third trimester: Secondary | ICD-10-CM | POA: Diagnosis not present

## 2020-02-14 DIAGNOSIS — O0993 Supervision of high risk pregnancy, unspecified, third trimester: Secondary | ICD-10-CM | POA: Diagnosis not present

## 2020-02-14 DIAGNOSIS — O99210 Obesity complicating pregnancy, unspecified trimester: Secondary | ICD-10-CM

## 2020-02-14 DIAGNOSIS — Z3A36 36 weeks gestation of pregnancy: Secondary | ICD-10-CM | POA: Diagnosis not present

## 2020-02-14 NOTE — Patient Instructions (Signed)

## 2020-02-14 NOTE — Progress Notes (Signed)
ROB/NST/US C/o severe pelvic pressure, and heartburn Denies lof, no vb, Good FM

## 2020-02-14 NOTE — Progress Notes (Signed)
Routine Prenatal Care Visit  Subjective  Karina Mason is a 34 y.o. G2P1001 at [redacted]w[redacted]d being seen today for ongoing prenatal care.  She is currently monitored for the following issues for this high-risk pregnancy and has Supervision of high-risk pregnancy; Anxiety during pregnancy, antepartum; Depression affecting pregnancy, antepartum; Gestational diabetes mellitus (GDM), antepartum; and Obesity affecting pregnancy, antepartum on their problem list.  ----------------------------------------------------------------------------------- Patient reports pelvic pressure.  She did not bring her glucose log today.  She reports that her values have been very similar to the last time she was here.  She says that she will send [place] a photo of it later today.  Contractions: Not present. Vag. Bleeding: None.  Movement: Present. Denies leaking of fluid.  ----------------------------------------------------------------------------------- The following portions of the patient's history were reviewed and updated as appropriate: allergies, current medications, past family history, past medical history, past social history, past surgical history and problem list. Problem list updated.   Objective  Blood pressure 110/68, weight 266 lb (120.7 kg), last menstrual period 06/07/2019. Pregravid weight 254 lb (115.2 kg) Total Weight Gain 12 lb (5.443 kg) Urinalysis:      Fetal Status: Fetal Heart Rate (bpm): 150   Movement: Present  Presentation: Vertex  General:  Alert, oriented and cooperative. Patient is in no acute distress.  Skin: Skin is warm and dry. No rash noted.   Cardiovascular: Normal heart rate noted  Respiratory: Normal respiratory effort, no problems with respiration noted  Abdomen: Soft, gravid, appropriate for gestational age. Pain/Pressure: Present     Pelvic:  Cervical exam performed Dilation: 1 Effacement (%): 20 Station: -3  Extremities: Normal range of motion.  Edema: None  Mental Status:  Normal mood and affect. Normal behavior. Normal judgment and thought content.   NST: 150 bpm baseline, moderate variability, 15x15 accelerations, no decelerations.   Assessment   34 y.o. G2P1001 at [redacted]w[redacted]d by  03/13/2020, by Last Menstrual Period presenting for routine prenatal visit  Plan   pregnancy2 Problems (from 06/07/19 to present)    Problem Noted Resolved   Supervision of high-risk pregnancy 08/04/2019 by Rexene Agent, CNM No   Overview Addendum 11/17/2019  9:54 PM by Dalia Heading, [place] Prenatal Labs  Dating LMP, [place] agrees Blood type: A/Positive/-- (08/24 1505)   Genetic Screen declines Antibody:Negative (08/24 1505)  Anatomic [place] 09/2019 nml Rubella: 1.36 (08/24 1505) Varicella:    GTT Early: 159, 3 hr GTT abn : 109/227/198/120 RPR: Non Reactive (08/24 1505)   Rhogam A+ HBsAg: Negative (08/24 1505)   TDaP vaccine           Flu Shot:declines HIV: Non Reactive (08/24 1505)   Baby Food     Breast GBS:   Contraception     IUD Pap: 03/26/2018, NLIM/HPV Negative  CBB  No   CS/VBAC n/a   Support Person Mom                 Gestational age appropriate obstetric precautions including but not limited to vaginal bleeding, contractions, leaking of fluid and fetal movement were reviewed in detail with the patient.    Discussed risks with the patient regarding gestational size and EFW at 40 weeks being 4,462 grams.  Patient thus far has been a diet controlled gestational diabetic. ACOG recommendations for 39 0/7 - 39 6/7 delivery into a well-controlled diet controlled gestational diabetic reviewed with the patient.  Glucose log not available for today's visit.  Difficult to assess how well controlled  her diabetes has been at this time.  Discussed with patient that given her elevated estimated fetal weight she does have the option for an elective primary cesarean section because of her increased risk of shoulder dystocia.  Discussed that the numbers primary  C-sections needed to treat or prevent a shoulder dystocia is considered to be 1 in  588.  Discussed that estimates the fetal weight can be innacurate close to term with fluctuations of +/- 500 grams.  We discussed shoulder dystocia and what that entails as well as possible complications for the infant including nerve injury, permanent injuries and deficits in the arm or hand, as well as brain injuries and fetal death associated with prolonged shoulder dystocia's.  After a a detailed discussion of her options regarding elective C-section and risks and benefits she reports that an elective C-section is not her preference.  She reports that the thought of a cesarean gives her anxiety and high blood pressure. She does not like the idea of having an epidural or spinal and is uncomfortable with the notion of being numb from the waist down.  It would be her preference to have a 39-week induction.  She reports that her last baby was 7-1/2 pounds and delivered vaginally without any problems such as shoulder dystocia.  Although this was 10 years ago and that pregnancy was not complicated by gestational diabetes.  Will refer to MFM for further guidance regarding delivery timing.    Return in about 1 week (around 02/21/2020) for ROB/ NST, in person.  Natale Milch MD [facility], [facility] 02/14/2020, 2:05 PM

## 2020-02-16 LAB — CERVICOVAGINAL ANCILLARY ONLY
Chlamydia: NEGATIVE
Comment: NEGATIVE
Comment: NORMAL
Neisseria Gonorrhea: NEGATIVE

## 2020-02-17 ENCOUNTER — Other Ambulatory Visit: Payer: Self-pay

## 2020-02-17 ENCOUNTER — Encounter: Payer: Self-pay | Admitting: Obstetrics and Gynecology

## 2020-02-17 ENCOUNTER — Inpatient Hospital Stay
Admission: EM | Admit: 2020-02-17 | Discharge: 2020-02-17 | Disposition: A | Discharge status: Home / Self Care | Payer: 59 | Attending: Obstetrics and Gynecology | Admitting: Obstetrics and Gynecology

## 2020-02-17 DIAGNOSIS — R2689 Other abnormalities of gait and mobility: Secondary | ICD-10-CM

## 2020-02-17 DIAGNOSIS — M25559 Pain in unspecified hip: Secondary | ICD-10-CM | POA: Diagnosis present

## 2020-02-17 DIAGNOSIS — R102 Pelvic and perineal pain: Secondary | ICD-10-CM | POA: Diagnosis not present

## 2020-02-17 DIAGNOSIS — M25552 Pain in left hip: Secondary | ICD-10-CM | POA: Insufficient documentation

## 2020-02-17 DIAGNOSIS — O0993 Supervision of high risk pregnancy, unspecified, third trimester: Secondary | ICD-10-CM

## 2020-02-17 DIAGNOSIS — Z3A36 36 weeks gestation of pregnancy: Secondary | ICD-10-CM | POA: Diagnosis not present

## 2020-02-17 DIAGNOSIS — M545 Low back pain: Secondary | ICD-10-CM

## 2020-02-17 DIAGNOSIS — O26893 Other specified pregnancy related conditions, third trimester: Secondary | ICD-10-CM | POA: Insufficient documentation

## 2020-02-17 DIAGNOSIS — Z331 Pregnant state, incidental: Secondary | ICD-10-CM

## 2020-02-17 DIAGNOSIS — M25551 Pain in right hip: Secondary | ICD-10-CM

## 2020-02-17 MED ORDER — ACETAMINOPHEN 500 MG PO TABS: 1000.0000 mg | ORAL_TABLET | Freq: Four times a day (QID) | ORAL | 2 refills | Status: DC | PRN

## 2020-02-17 NOTE — Discharge Instructions (Signed)
LABOR: When contractions begin, you should start to time them from the beginning of one contraction to the beginning of the next.  When contractions are 5-10 minutes apart or less and have been regular for at least an hour, you should call your health care provider.  Notify your doctor if any of the following occur: 1. Bleeding from the vagina 7. Sudden, constant, or occasional abdominal pain  2. Pain or burning when urinating 8. Sudden gushing of fluid from the vagina (with or without continued leaking)  3. Chills or fever 9. Fainting spells, "black outs" or loss of consciousness  4. Increase in vaginal discharge 10. Severe or continued nausea or vomiting  5. Pelvic pressure (sudden increase) 11. Blurring of vision or spots before the eyes  6. Baby moving less than usual 12. Leaking of fluid    FETAL KICK COUNT: Lie on your left side for one hour after a meal, and count the number of times your baby kicks. If it is less than 5 times, get up, move around and drink some juice. Repeat the test 30 minutes later. If it is still less than 5 kicks in an hour, notify your doctor.Round Ligament Pain  The round ligament is a cord of muscle and tissue that helps support the uterus. It can become a source of pain during pregnancy if it becomes stretched or twisted as the baby grows. The pain usually begins in the second trimester (13-28 weeks) of pregnancy, and it can come and go until the baby is delivered. It is not a serious problem, and it does not cause harm to the baby. Round ligament pain is usually a short, sharp, and pinching pain, but it can also be a dull, lingering, and aching pain. The pain is felt in the lower side of the abdomen or in the groin. It usually starts deep in the groin and moves up to the outside of the hip area. The pain may occur when you:  Suddenly change position, such as quickly going from a sitting to standing position.  Roll over in bed.  Cough or sneeze.  Do physical  activity. Follow these instructions at home:   Watch your condition for any changes.  When the pain starts, relax. Then try any of these methods to help with the pain: ? Sitting down. ? Flexing your knees up to your abdomen. ? Lying on your side with one pillow under your abdomen and another pillow between your legs. ? Sitting in a warm bath for 15-20 minutes or until the pain goes away.  Take over-the-counter and prescription medicines only as told by your health care provider.  Move slowly when you sit down or stand up.  Avoid long walks if they cause pain.  Stop or reduce your physical activities if they cause pain.  Keep all follow-up visits as told by your health care provider. This is important. Contact a health care provider if:  Your pain does not go away with treatment.  You feel pain in your back that you did not have before.  Your medicine is not helping. Get help right away if:  You have a fever or chills.  You develop uterine contractions.  You have vaginal bleeding.  You have nausea or vomiting.  You have diarrhea.  You have pain when you urinate. Summary  Round ligament pain is felt in the lower abdomen or groin. It is usually a short, sharp, and pinching pain. It can also be a dull, lingering, and  aching pain.  This pain usually begins in the second trimester (13-28 weeks). It occurs because the uterus is stretching with the growing baby, and it is not harmful to the baby.  You may notice the pain when you suddenly change position, when you cough or sneeze, or during physical activity.  Relaxing, flexing your knees to your abdomen, lying on one side, or taking a warm bath may help to get rid of the pain.  Get help from your health care provider if the pain does not go away or if you have vaginal bleeding, nausea, vomiting, diarrhea, or painful urination. This information is not intended to replace advice given to you by your health care provider.  Make sure you discuss any questions you have with your health care provider. Document Revised: 05/27/2018 Document Reviewed: 05/27/2018 Elsevier Patient Education  2020 [employer] Hip Pain The hip is the joint between the upper legs and the lower pelvis. The bones, cartilage, tendons, and muscles of your hip joint support your body and allow you to move around. Hip pain can range from a minor ache to severe pain in one or both of your hips. The pain may be felt on the inside of the hip joint near the groin, or on the outside near the buttocks and upper thigh. You may also have swelling or stiffness in your hip area. Follow these instructions at home: Managing pain, stiffness, and swelling      If directed, put ice on the painful area. To do this: ? Put ice in a plastic bag. ? Place a towel between your skin and the bag. ? Leave the ice on for 20 minutes, 2-3 times a day.  If directed, apply heat to the affected area as often as told by your health care provider. Use the heat source that your health care provider recommends, such as a moist heat pack or a heating pad. ? Place a towel between your skin and the heat source. ? Leave the heat on for 20-30 minutes. ? Remove the heat if your skin turns bright red. This is especially important if you are unable to feel pain, heat, or cold. You may have a greater risk of getting burned. Activity  Do exercises as told by your health care provider.  Avoid activities that cause pain. General instructions   Take over-the-counter and prescription medicines only as told by your health care provider.  Keep a journal of your symptoms. Write down: ? How often you have hip pain. ? The location of your pain. ? What the pain feels like. ? What makes the pain worse.  Sleep with a pillow between your legs on your most comfortable side.  Keep all follow-up visits as told by your health care provider. This is important. Contact a health care  provider if:  You cannot put weight on your leg.  Your pain or swelling continues or gets worse after one week.  It gets harder to walk.  You have a fever. Get help right away if:  You fall.  You have a sudden increase in pain and swelling in your hip.  Your hip is red or swollen or very tender to touch. Summary  Hip pain can range from a minor ache to severe pain in one or both of your hips.  The pain may be felt on the inside of the hip joint near the groin, or on the outside near the buttocks and upper thigh.  Avoid activities that cause pain.  Write down how often you have hip pain, the location of the pain, what makes it worse, and what it feels like. This information is not intended to replace advice given to you by your health care provider. Make sure you discuss any questions you have with your health care provider. Document Revised: 04/26/2019 Document Reviewed: 04/26/2019 Elsevier Patient Education  [place].

## 2020-02-17 NOTE — Progress Notes (Signed)
Please call patient with normal lab result

## 2020-02-17 NOTE — Discharge Summary (Signed)
Physician Discharge Summary   Patient ID: Karina Mason [id number] 34 y.o. Oct 08, 1986  Admit date: 02/17/2020  Discharge date and time: No discharge date for patient encounter.   Admitting Physician: Homero Fellers, MD   Discharge Physician: Adrian Prows MD  Admission Diagnoses: Hip pain [M25.559]  Discharge Diagnoses: Hip pain  Admission Condition: good  Discharged Condition: good  Indication for Admission: Evaluation of Hip pain  Hospital Course: Patient was seen on labor and delivery. She had a reactive tracing. She reported severe acute onset hip pain which was severe enough that she is having difficulty walking. After reactive tracing was obtained she was offered to be evaluated in the ER, but she declined and reported to nursing that she would prefer to be discharged. She has close follow up planned for Monday of next week in office.   Consults: None  Significant Diagnostic Studies: none  Treatments: none  Discharge Exam: BP 123/74 (BP Location: Left Arm)   Pulse 94   Temp 98.2 F (36.8 C) (Oral)   Resp 20   Ht _0  (1.778 m)   Wt 120.7 kg   LMP 06/07/2019 (Exact Date)   BMI 38.17 kg/m   General Appearance:    Alert, cooperative, no distress, appears stated age  Head:    Normocephalic, without obvious abnormality, atraumatic  Eyes:    PERRL, conjunctiva/corneas clear, EOM's intact, fundi    benign, both eyes  Ears:    Normal TM's and external ear canals, both ears  Nose:   Nares normal, septum midline, mucosa normal, no drainage    or sinus tenderness  Throat:   Lips, mucosa, and tongue normal; teeth and gums normal  Neck:   Supple, symmetrical, trachea midline, no adenopathy;    thyroid:  no enlargement/tenderness/nodules; no carotid   bruit or JVD  Back:     Symmetric, no curvature, ROM normal, no CVA tenderness  Lungs:     Clear to auscultation bilaterally, respirations unlabored  Chest Wall:    No tenderness or deformity   Heart:     Regular rate and rhythm, S1 and S2 normal, no murmur, rub   or gallop  Breast Exam:    No tenderness, masses, or nipple abnormality  Abdomen:     Soft, non-tender, bowel sounds active all four quadrants,    no masses, no organomegaly  Genitalia:    Normal female without lesion, discharge or tenderness  Rectal:    Normal tone, normal prostate, no masses or tenderness;   guaiac negative stool  Extremities:   Extremities normal, atraumatic, no cyanosis or edema  Pulses:   2+ and symmetric all extremities  Skin:   Skin color, texture, turgor normal, no rashes or lesions  Lymph nodes:   Cervical, supraclavicular, and axillary nodes normal  Neurologic:   CNII-XII intact, normal strength, sensation and reflexes    throughout    Disposition: Discharge disposition: 01-Home or Self Care       Patient Instructions:  Allergies as of 02/17/2020      Reactions   Shellfish Allergy Shortness Of Breath      Medication List    TAKE these medications   Accu-Chek FastClix Lancets Misc 1 Units by Percutaneous route 4 (four) times daily.   Accu-Chek Nano SmartView w/Device Kit 1 kit by Subdermal route as directed. Check blood sugars for fasting, and two hours after breakfast, lunch and dinner (4 checks daily)   Accu-Chek SmartView test strip Generic drug: glucose blood Use as instructed  to check blood sugars   acetaminophen 500 MG tablet Commonly known as: TYLENOL Take 2 tablets (1,000 mg total) by mouth every 6 (six) hours as needed.   Concept OB 130-92.4-1 MG Caps Take 1 capsule by mouth daily.      Activity: activity as tolerated Diet: regular diet Wound Care: none needed  Follow-up with [facility] as planned.   Signed: Homero Fellers 02/17/2020 10:08 PM

## 2020-02-17 NOTE — OB Triage Note (Signed)
Pt 34 yo, G2P1, [redacted]w[redacted]d, presents to unit with c/o 8/10 pain in lower back and L hip/groin. States pain has been going on since Monday and worsened today. Reports it feels sharp and shooting and remains constant. Denies vaginal bleeding, LOF, decreased fetal movement, and ctxs. Monitors applied and assessing, VSS. FHT 145bpm. Will continue to monitor.

## 2020-02-18 LAB — CULTURE, BETA STREP (GROUP B ONLY): Strep Gp B Culture: NEGATIVE

## 2020-02-18 NOTE — Telephone Encounter (Signed)
Pt ended up going to ER last night.

## 2020-02-21 ENCOUNTER — Other Ambulatory Visit: Payer: Self-pay | Admitting: Obstetrics and Gynecology

## 2020-02-21 ENCOUNTER — Encounter: Payer: Self-pay | Admitting: Obstetrics and Gynecology

## 2020-02-21 ENCOUNTER — Telehealth: Payer: Self-pay | Admitting: Obstetrics and Gynecology

## 2020-02-21 ENCOUNTER — Ambulatory Visit (INDEPENDENT_AMBULATORY_CARE_PROVIDER_SITE_OTHER): Payer: 59 | Admitting: Obstetrics and Gynecology

## 2020-02-21 ENCOUNTER — Other Ambulatory Visit: Payer: Self-pay

## 2020-02-21 ENCOUNTER — Ambulatory Visit: Payer: 59

## 2020-02-21 VITALS — BP 110/70 | Wt 264.0 lb

## 2020-02-21 DIAGNOSIS — O24414 Gestational diabetes mellitus in pregnancy, insulin controlled: Secondary | ICD-10-CM

## 2020-02-21 DIAGNOSIS — O99340 Other mental disorders complicating pregnancy, unspecified trimester: Secondary | ICD-10-CM

## 2020-02-21 DIAGNOSIS — Z3A37 37 weeks gestation of pregnancy: Secondary | ICD-10-CM | POA: Diagnosis not present

## 2020-02-21 DIAGNOSIS — O99343 Other mental disorders complicating pregnancy, third trimester: Secondary | ICD-10-CM

## 2020-02-21 DIAGNOSIS — O99213 Obesity complicating pregnancy, third trimester: Secondary | ICD-10-CM

## 2020-02-21 DIAGNOSIS — O99210 Obesity complicating pregnancy, unspecified trimester: Secondary | ICD-10-CM

## 2020-02-21 DIAGNOSIS — F419 Anxiety disorder, unspecified: Secondary | ICD-10-CM

## 2020-02-21 DIAGNOSIS — O0993 Supervision of high risk pregnancy, unspecified, third trimester: Secondary | ICD-10-CM

## 2020-02-21 DIAGNOSIS — F329 Major depressive disorder, single episode, unspecified: Secondary | ICD-10-CM

## 2020-02-21 MED ORDER — LANTUS SOLOSTAR 100 UNIT/ML ~~LOC~~ SOPN: 5.0000 [IU] | PEN_INJECTOR | Freq: Every day | SUBCUTANEOUS | 0 refills | Status: DC

## 2020-02-21 MED ORDER — COMFORT EZ PEN NEEDLES 31G X 5 MM MISC: 1.0000 | Freq: Every day | 0 refills | Status: DC

## 2020-02-21 NOTE — Progress Notes (Signed)
ROB/NST  Pain is feeling better Denies lof, no vb, Good FM

## 2020-02-21 NOTE — Telephone Encounter (Signed)
Called and discussed IOL recommendation with patient for 37-38 wks- she felt a little overwhelmed with this information and wanted to process. She will recall tomorrow- wanted to let you know too because she is curretnly scheduled to see you on Thursday.

## 2020-02-21 NOTE — Progress Notes (Signed)
Routine Prenatal Care Visit  Subjective  Karina Mason is a 34 y.o. G2P1001 at [redacted]w[redacted]d being seen today for ongoing prenatal care.  She is currently monitored for the following issues for this high-risk pregnancy and has Supervision of high-risk pregnancy; Anxiety during pregnancy, antepartum; Depression affecting pregnancy, antepartum; Gestational diabetes mellitus (GDM), antepartum; Obesity affecting pregnancy, antepartum; and Hip pain on their problem list.  ----------------------------------------------------------------------------------- Patient reports no complaints.   Contractions: Not present. Vag. Bleeding: None.  Movement: Present. Denies leaking of fluid.  ----------------------------------------------------------------------------------- The following portions of the patient's history were reviewed and updated as appropriate: allergies, current medications, past family history, past medical history, past social history, past surgical history and problem list. Problem list updated.   Objective  Blood pressure 110/70, weight 264 lb (119.7 kg), last menstrual period 06/07/2019. Pregravid weight 254 lb (115.2 kg) Total Weight Gain 10 lb (4.536 kg) Urinalysis:      Fetal Status: Fetal Heart Rate (bpm): 145   Movement: Present     General:  Alert, oriented and cooperative. Patient is in no acute distress.  Skin: Skin is warm and dry. No rash noted.   Cardiovascular: Normal heart rate noted  Respiratory: Normal respiratory effort, no problems with respiration noted  Abdomen: Soft, gravid, appropriate for gestational age. Pain/Pressure: Present     Pelvic:  Cervical exam deferred        Extremities: Normal range of motion.  Edema: None  Mental Status: Normal mood and affect. Normal behavior. Normal judgment and thought content.     Assessment   34 y.o. G2P1001 at [redacted]w[redacted]d by  03/13/2020, by Last Menstrual Period presenting for routine prenatal visit  Plan   pregnancy2  Problems (from 06/07/19 to present)    Problem Noted Resolved   Supervision of high-risk pregnancy 08/04/2019 by Rexene Agent, CNM No   Overview Addendum 11/17/2019  9:54 PM by Dalia Heading, [place] Prenatal Labs  Dating LMP, [place] agrees Blood type: A/Positive/-- (08/24 1505)   Genetic Screen declines Antibody:Negative (08/24 1505)  Anatomic [place] 09/2019 nml Rubella: 1.36 (08/24 1505) Varicella:    GTT Early: 159, 3 hr GTT abn : 109/227/198/120 RPR: Non Reactive (08/24 1505)   Rhogam A+ HBsAg: Negative (08/24 1505)   TDaP vaccine           Flu Shot:declines HIV: Non Reactive (08/24 1505)   Baby Food     Breast GBS:   Contraception     IUD Pap: 03/26/2018, NLIM/HPV Negative  CBB  No   CS/VBAC n/a   Support Person Mom                 Gestational age appropriate obstetric precautions including but not limited to vaginal bleeding, contractions, leaking of fluid and fetal movement were reviewed in detail with the patient.    Hip pain has improved with Tylenol and heating pad.  Patient has a prenatal massage scheduled.  She declines physical therapy referral at this time.  Discussed glucose log with patient.  She is good at taking her blood sugars when she wakes up and after breakfast however she has less values for after lunch and after dinner.  This makes it difficult to assess her overall glucose control during this pregnancy.  Of available values 27 of the 46 are elevated although the elevations are not extreme.  Photo of log taken and uploaded to the record.  Discussed initiation of insulin with the patient.  She is not  willing to initiate insulin at this time recommended 5 units of Lantus at bedtime to see if fasting glucose values can be improved.  Will initiate twice weekly NSTs given initiation of insulin and change from diet controlled to insulin controlled gestational diabetes.   Patient has follow-up later today with maternal-fetal medicine.  We will follow any  of the recommendations for delivery timing although at this time have been anticipating 39-week induction of labor will schedule next week.   NST reactive baseline 145 bpm moderate variability two 15 x 15 accelerations noted in a 20-minute.  Given book about breast-feeding. Breast-feeding discussed.  Return in about 3 days (around 02/24/2020) for ROB/NST in person.  Natale Milch MD [facility], [facility] 02/21/2020, 11:40 AM

## 2020-02-21 NOTE — Patient Instructions (Signed)
Pain Relief During Labor and Delivery Many things can cause pain during labor and delivery, including:  Pressure on bones and ligaments due to the baby moving through the pelvis.  Stretching of tissues due to the baby moving through the birth canal.  Muscle tension due to anxiety or nervousness.  The uterus tightening (contracting) and relaxing to help move the baby. There are many ways to deal with the pain of labor and delivery. They include:  Taking prenatal classes. Taking these classes helps you know what to expect during your baby's birth. What you learn will increase your confidence and decrease your anxiety.  Practicing relaxation techniques or doing relaxing activities, such as: ? Focused breathing. ? Meditation. ? Visualization. ? Aroma therapy. ? Listening to your favorite music. ? Hypnosis.  Taking a warm shower or bath (hydrotherapy). This may: ? Provide comfort and relaxation. ? Lessen your perception of pain. ? Decrease the amount of pain medicine needed. ? Decrease the length of labor.  Getting a massage or counterpressure on your back.  Applying warm packs or ice packs.  Changing positions often, moving around, or using a birthing ball.  Getting: ? Pain medicine through an IV or injection into a muscle. ? Pain medicine inserted into your spinal column. ? Injections of sterile water just under the skin on your lower back (intradermal injections). ? Laughing gas (nitrous oxide). Discuss your pain control options with your health care provider during your prenatal visits. Explore the options offered by your hospital or birth center. What kinds of medicine are available? There are two kinds of medicines that can be used to relieve pain during labor and delivery:  Analgesics. These medicines decrease pain without causing you to lose feeling or the ability to move your muscles.  Anesthetics. These medicines block feeling in the body and can decrease your  ability to move freely. Both of these kinds of medicine can cause minor side effects, such as nausea, trouble concentrating, and sleepiness. They can also decrease the baby's heart rate before birth and affect the baby's breathing rate after birth. For this reason, health care providers are careful about when and how much medicine is given. What are specific medicines and procedures that provide pain relief? Local Anesthetics Local anesthetics are used to numb a small area of the body. They may be used along with another kind of anesthetic or used to numb the nerves of the vagina, cervix, and perineum during the second stage of labor. General Anesthetics General anesthetics cause you to lose consciousness so you do not feel pain. They are usually only used for an emergency cesarean delivery. General anesthetics are given through an IV tube and a mask. Pudendal Block A pudendal block is a form of local anesthetic. It may be used to relieve the pain associated with pushing or stretching of the perineum at the time of delivery or to further numb the perineum. A pudendal block is done by injecting numbing medicine through the vaginal wall into a nerve in the pelvis. Epidural Analgesia Epidural analgesia is given through a flexible IV catheter that is inserted into the lower back. Numbing medicine is delivered continuously to the area near your spinal column nerves (epidural space). After having this type of analgesia, you may be able to move your legs but you most likely will not be able to walk. Depending on the amount of medicine given, you may lose all feeling in the lower half of your body, or you may retain some level   of sensation, including the urge to push. Epidural analgesia can be used to provide pain relief for a vaginal birth. Spinal Block A spinal block is similar to epidural analgesia, but the medicine is injected into the spinal fluid instead of the epidural space. A spinal block is only given  once. It starts to relieve pain quickly, but the pain relief lasts only 1-6 hours. Spinal blocks can be used for cesarean deliveries. Combined Spinal-Epidural (CSE) Block A CSE block combines the effects of a spinal block and epidural analgesia. The spinal block works quickly to block all pain. The epidural analgesia provides continuous pain relief, even after the effects of the spinal block have worn off. This information is not intended to replace advice given to you by your health care provider. Make sure you discuss any questions you have with your health care provider. Document Revised: 11/21/2017 Document Reviewed: 05/01/2016 Elsevier Patient Education  2020 Else[employer] Inc. Vaginal Delivery  Vaginal delivery means that you give birth by pushing your baby out of your birth canal (vagina). A team of health care providers will help you before, during, and after vaginal delivery. Birth experiences are unique for every woman and every pregnancy, and birth experiences vary depending on where you choose to give birth. What happens when I arrive at the birth center or hospital? Once you are in labor and have been admitted into the hospital or birth center, your health care provider may:  Review your pregnancy history and any concerns that you have.  Insert an IV into one of your veins. This may be used to give you fluids and medicines.  Check your blood pressure, pulse, temperature, and heart rate (vital signs).  Check whether your bag of water (amniotic sac) has broken (ruptured).  Talk with you about your birth plan and discuss pain control options. Monitoring Your health care provider may monitor your contractions (uterine monitoring) and your baby's heart rate (fetal monitoring). You may need to be monitored:  Often, but not continuously (intermittently).  All the time or for long periods at a time (continuously). Continuous monitoring may be needed if: ? You are taking certain medicines,  such as medicine to relieve pain or make your contractions stronger. ? You have pregnancy or labor complications. Monitoring may be done by:  Placing a special stethoscope or a handheld monitoring device on your abdomen to check your baby's heartbeat and to check for contractions.  Placing monitors on your abdomen (external monitors) to record your baby's heartbeat and the frequency and length of contractions.  Placing monitors inside your uterus through your vagina (internal monitors) to record your baby's heartbeat and the frequency, length, and strength of your contractions. Depending on the type of monitor, it may remain in your uterus or on your baby's head until birth.  Telemetry. This is a type of continuous monitoring that can be done with external or internal monitors. Instead of having to stay in bed, you are able to move around during telemetry. Physical exam Your health care provider may perform frequent physical exams. This may include:  Checking how and where your baby is positioned in your uterus.  Checking your cervix to determine: ? Whether it is thinning out (effacing). ? Whether it is opening up (dilating). What happens during labor and delivery?  Normal labor and delivery is divided into the following three stages: Stage 1  This is the longest stage of labor.  This stage can last for hours or days.  Throughout this stage,   you will feel contractions. Contractions generally feel mild, infrequent, and irregular at first. They get stronger, more frequent (about every 2-3 minutes), and more regular as you move through this stage.  This stage ends when your cervix is completely dilated to 4 inches (10 cm) and completely effaced. Stage 2  This stage starts once your cervix is completely effaced and dilated and lasts until the delivery of your baby.  This stage may last from 20 minutes to 2 hours.  This is the stage where you will feel an urge to push your baby out of  your vagina.  You may feel stretching and burning pain, especially when the widest part of your baby's head passes through the vaginal opening (crowning).  Once your baby is delivered, the umbilical cord will be clamped and cut. This usually occurs after waiting a period of 1-2 minutes after delivery.  Your baby will be placed on your bare chest (skin-to-skin contact) in an upright position and covered with a warm blanket. Watch your baby for feeding cues, like rooting or sucking, and help the baby to your breast for his or her first feeding. Stage 3  This stage starts immediately after the birth of your baby and ends after you deliver the placenta.  This stage may take anywhere from 5 to 30 minutes.  After your baby has been delivered, you will feel contractions as your body expels the placenta and your uterus contracts to control bleeding. What can I expect after labor and delivery?  After labor is over, you and your baby will be monitored closely until you are ready to go home to ensure that you are both healthy. Your health care team will teach you how to care for yourself and your baby.  You and your baby will stay in the same room (rooming in) during your hospital stay. This will encourage early bonding and successful breastfeeding.  You may continue to receive fluids and medicines through an IV.  Your uterus will be checked and massaged regularly (fundal massage).  You will have some soreness and pain in your abdomen, vagina, and the area of skin between your vaginal opening and your anus (perineum).  If an incision was made near your vagina (episiotomy) or if you had some vaginal tearing during delivery, cold compresses may be placed on your episiotomy or your tear. This helps to reduce pain and swelling.  You may be given a squirt bottle to use instead of wiping when you go to the bathroom. To use the squirt bottle, follow these steps: ? Before you urinate, fill the squirt  bottle with warm water. Do not use hot water. ? After you urinate, while you are sitting on the toilet, use the squirt bottle to rinse the area around your urethra and vaginal opening. This rinses away any urine and blood. ? Fill the squirt bottle with clean water every time you use the bathroom.  It is normal to have vaginal bleeding after delivery. Wear a sanitary pad for vaginal bleeding and discharge. Summary  Vaginal delivery means that you will give birth by pushing your baby out of your birth canal (vagina).  Your health care provider may monitor your contractions (uterine monitoring) and your baby's heart rate (fetal monitoring).  Your health care provider may perform a physical exam.  Normal labor and delivery is divided into three stages.  After labor is over, you and your baby will be monitored closely until you are ready to go home. This information   is not intended to replace advice given to you by your health care provider. Make sure you discuss any questions you have with your health care provider. Document Revised: 01/13/2018 Document Reviewed: 01/13/2018 Elsevier Patient Education  2020 [employer] Inc. Labor Induction  Labor induction is when steps are taken to cause a pregnant woman to begin the labor process. Most women go into labor on their own between 37 weeks and 42 weeks of pregnancy. When this does not happen or when there is a medical need for labor to begin, steps may be taken to induce labor. Labor induction causes a pregnant woman's uterus to contract. It also causes the cervix to soften (ripen), open (dilate), and thin out (efface). Usually, labor is not induced before 39 weeks of pregnancy unless there is a medical reason to do so. Your health care provider will determine if labor induction is needed. Before inducing labor, your health care provider will consider a number of factors, including:  Your medical condition and your baby's.  How many weeks along you  are in your pregnancy.  How mature your baby's lungs are.  The condition of your cervix.  The position of your baby.  The size of your birth canal. What are some reasons for labor induction? Labor may be induced if:  Your health or your baby's health is at risk.  Your pregnancy is overdue by 1 week or more.  Your water breaks but labor does not start on its own.  There is a low amount of amniotic fluid around your baby. You may also choose (elect) to have labor induced at a certain time. Generally, elective labor induction is done no earlier than 39 weeks of pregnancy. What methods are used for labor induction? Methods used for labor induction include:  Prostaglandin medicine. This medicine starts contractions and causes the cervix to dilate and ripen. It can be taken by mouth (orally) or by being inserted into the vagina (suppository).  Inserting a small, thin tube (catheter) with a balloon into the vagina and then expanding the balloon with water to dilate the cervix.  Stripping the membranes. In this method, your health care provider gently separates amniotic sac tissue from the cervix. This causes the cervix to stretch, which in turn causes the release of a hormone called progesterone. The hormone causes the uterus to contract. This procedure is often done during an office visit, after which you will be sent home to wait for contractions to begin.  Breaking the water. In this method, your health care provider uses a small instrument to make a small hole in the amniotic sac. This eventually causes the amniotic sac to break. Contractions should begin after a few hours.  Medicine to trigger or strengthen contractions. This medicine is given through an IV that is inserted into a vein in your arm. Except for membrane stripping, which can be done in a clinic, labor induction is done in the hospital so that you and your baby can be carefully monitored. How long does it take for labor to  be induced? The length of time it takes to induce labor depends on how ready your body is for labor. Some inductions can take up to 2-3 days, while others may take less than a day. Induction may take longer if:  You are induced early in your pregnancy.  It is your first pregnancy.  Your cervix is not ready. What are some risks associated with labor induction? Some risks associated with labor induction include:    Changes in fetal heart rate, such as being too high, too low, or irregular (erratic).  Failed induction.  Infection in the mother or the baby.  Increased risk of having a cesarean delivery.  Fetal death.  Breaking off (abruption) of the placenta from the uterus (rare).  Rupture of the uterus (very rare). When induction is needed for medical reasons, the benefits of induction generally outweigh the risks. What are some reasons for not inducing labor? Labor induction should not be done if:  Your baby does not tolerate contractions.  You have had previous surgeries on your uterus, such as a myomectomy, removal of fibroids, or a vertical scar from a previous cesarean delivery.  Your placenta lies very low in your uterus and blocks the opening of the cervix (placenta previa).  Your baby is not in a head-down position.  The umbilical cord drops down into the birth canal in front of the baby.  There are unusual circumstances, such as the baby being very early (premature).  You have had more than 2 previous cesarean deliveries. Summary  Labor induction is when steps are taken to cause a pregnant woman to begin the labor process.  Labor induction causes a pregnant woman's uterus to contract. It also causes the cervix to ripen, dilate, and efface.  Labor is not induced before 39 weeks of pregnancy unless there is a medical reason to do so.  When induction is needed for medical reasons, the benefits of induction generally outweigh the risks. This information is not  intended to replace advice given to you by your health care provider. Make sure you discuss any questions you have with your health care provider. Document Revised: 12/12/2017 Document Reviewed: 01/22/2017 Elsevier Patient Education  2020 Elsevier Inc.  

## 2020-02-21 NOTE — Telephone Encounter (Signed)
Patient is calling for labs results. Please advise. 

## 2020-02-24 ENCOUNTER — Encounter: Payer: 59 | Admitting: Certified Nurse Midwife

## 2020-02-25 ENCOUNTER — Telehealth: Payer: Self-pay | Admitting: Certified Nurse Midwife

## 2020-02-25 ENCOUNTER — Other Ambulatory Visit: Payer: Self-pay

## 2020-02-25 ENCOUNTER — Ambulatory Visit (INDEPENDENT_AMBULATORY_CARE_PROVIDER_SITE_OTHER): Payer: 59 | Admitting: Certified Nurse Midwife

## 2020-02-25 ENCOUNTER — Encounter: Payer: Self-pay | Admitting: Certified Nurse Midwife

## 2020-02-25 VITALS — BP 138/80 | Wt 265.0 lb

## 2020-02-25 DIAGNOSIS — Z0289 Encounter for other administrative examinations: Secondary | ICD-10-CM

## 2020-02-25 DIAGNOSIS — O24419 Gestational diabetes mellitus in pregnancy, unspecified control: Secondary | ICD-10-CM

## 2020-02-25 DIAGNOSIS — O0993 Supervision of high risk pregnancy, unspecified, third trimester: Secondary | ICD-10-CM

## 2020-02-25 DIAGNOSIS — O0990 Supervision of high risk pregnancy, unspecified, unspecified trimester: Secondary | ICD-10-CM

## 2020-02-25 DIAGNOSIS — Z3A37 37 weeks gestation of pregnancy: Secondary | ICD-10-CM

## 2020-02-25 DIAGNOSIS — Z3689 Encounter for other specified antenatal screening: Secondary | ICD-10-CM

## 2020-02-25 LAB — FETAL NONSTRESS TEST

## 2020-02-25 NOTE — Telephone Encounter (Signed)
I set Karina Mason up for an IOL on Monday at 38 weeks.

## 2020-02-25 NOTE — Progress Notes (Signed)
ROB/NST C/o threw up last night from allergic reaction and leaked possible urine Denies lof, no vb, Good FM

## 2020-02-25 NOTE — Telephone Encounter (Signed)
Patient is calling needing to update on FMLA info and will need a copy. Would you please call patient? Thank you

## 2020-02-27 NOTE — Progress Notes (Signed)
HROB at 37wk 4days: GDM not well controlled on diet. Dr Dolphus Jenny from [facility] reviewed record and recommended delivery at 37-38 weeks due to elevated blood sugars on diet. Did not begin insulin as recommended by Dr Jerene Pitch at last visit, since earlier delivery was recommended. DId not bring log.  Long discussion regarding pros and cons of delivery at 38 weeks vs 39 or later. Reviewed risks of uncontrolled GDM including increased risk of stillbirth, shoulder dystocia, lacerations, and Cesarean section.due to FTP. First baby born at 73 weeks and weighed 7#11oz EFW 2 weeks ago was 7#3oz (78.4% with AC 97.7%), so EFW now estimated at 8#3oz.  Patient also reports she had an allergic rxn when husband fried shrimp last night. She did not eat any but had reactive from smelling odor in house. Feels OK now. Baby active. NST reactive today with baseline 155 and accelerations to 170-180, moderate variability Cervix: 1/60%/-2/posterior/ soft  A: IUP at 37 wk4d with GDM, not well controlled on diet Reactive NST  P: Mutual decision to induce labor at 38 weeks (Monday, 8 March at 1500) Explained process of induction and normal methods used to induce labor. Will do Covid testing on admission GBS negative  Farrel Conners, CNM

## 2020-02-27 NOTE — H&P (Signed)
OB History & Physical   History of Present Illness:  Chief Complaint:  Presents to discuss induction of labor HPI:  Karina Mason is a 34 y.o. G106P1001 female with EDC=3/22/2021at 17w4ddated by LMP.  Her pregnancy has been complicated by obesity, gestational diabetes, anxiety and depression.  She presented to the office for a NST and to discuss induction of labor for GDM not well controlled on diet.  [facility] [place] [facility]. She reviewed record and recommended delivery at 37-38 weeks due to elevated blood sugars on diet. Did not begin insulin as recommended by Dr SGilman Schmidtat last visit, since earlier delivery was recommended. DId not bring blood sugar log to this appointment, but from previous provider note, many of the CBGs are elevated and patient is not fastidious with taking CBGs.   EFW 2 weeks ago was 7#3oz (78.4% with AC 97.7%), so EFW now estimated at 8#3oz.   [facility] Labs  Dating LMP, [place] Blood type: A/Positive/-- (08/24 1505)   Genetic Screen declines Antibody:Negative (08/24 1505)  Anatomic [place]/2020 nml Rubella: 1.36 (08/24 1505) Varicella:    GTT Early: 159, 3 hr GTT abn : 109/227/198/120 RPR: Non Reactive (08/24 1505)   Rhogam A+ HBsAg: Negative (08/24 1505)   TDaP vaccine           Flu Shot:declines HIV: Non Reactive (08/24 1505)   Baby Food     Breast GBS: negative  Contraception     IUD Pap: 03/26/2018, NLIM/HPV Negative  CBB  No   CS/VBAC n/a   Support Person Mom       First baby born at 476 weeksand weighed 7#11oz      Maternal Medical History:   Past Medical History:  Diagnosis Date  . Gestational diabetes   . History of abnormal cervical Pap smear     Past Surgical History:  Procedure Laterality Date  . INTRAUTERINE DEVICE (IUD) INSERTION      Allergies  Allergen Reactions  . Shellfish Allergy Shortness Of Breath     Prior to Admission medications   Medication Sig Start Date End Date Taking? Authorizing Provider  acetaminophen (TYLENOL) 500 MG tablet Take 2 tablets (1,000 mg total) by mouth every 6 (six) hours as needed. 02/17/20 02/16/21 Yes Schuman, Christanna R, MD  glucose blood (ACCU-CHEK SMARTVIEW) test strip Use as instructed to check blood sugars 09/15/19  Yes SRexene Agent CNM  Insulin Glargine (LANTUS SOLOSTAR) 100 UNIT/ML Solostar Pen Inject 5 Units into the skin daily. 02/21/20  Yes Schuman, Christanna R, MD  Insulin Pen Needle (COMFORT EZ PEN NEEDLES) 31G X 5 MM MISC 1 each by Does not apply route daily. 02/21/20  Yes Schuman, CStefanie Libel MD  Prenat w/o A Vit-FeFum-FePo-FA (CONCEPT OB) 130-92.4-1 MG CAPS Take 1 capsule by mouth daily. 11/09/19  Yes SRexene Agent CNM  Accu-Chek FastClix Lancets MISC 1 Units by Percutaneous route 4 (four) times daily. Patient not taking: Reported on 01/24/2020 09/15/19   SRexene Agent CNM  Blood Glucose Monitoring Suppl (ACCU-CHEK NANO SMARTVIEW) w/Device KIT 1 kit by Subdermal route as directed. Check blood sugars for fasting, and two hours after breakfast, lunch and dinner (4 checks daily) Patient not taking: Reported on 01/24/2020 09/15/19   SRexene Agent CNM          Social History: She  reports that she has never smoked.  She has never used smokeless tobacco. She reports that she does not drink alcohol or use drugs.  Family History: family history includes Diabetes in her father; Hypertension in her father and mother; Hypertension (age of onset: 62) in her brother and sister; Leukemia in her paternal grandfather; Other in her maternal grandfather; Stroke in her maternal grandmother.   Review of Systems: Negative x 10 systems reviewed except as noted in the HPI.      Physical Exam:  Vital Signs: BP 138/80   Wt 265 lb (120.2 kg)   LMP 06/07/2019 (Exact Date)   BMI 38.02 kg/m  General: gravid BF in no acute distress.  HEENT:  normocephalic, atraumatic Heart: regular rate & rhythm.  No murmurs/rubs/gallops Lungs: clear to auscultation bilaterally Abdomen: soft, gravid, non-tender;  EFW: 8#2oz Pelvic:   External: Normal external female genitalia  Cervix: Dilation: 1 / Effacement (%): 60 / Station: -2   Extremities: non-tender, symmetric, traace edema bilaterally.   Neurologic: Alert & oriented x 3.   Baseline FHR:  Assessment:  Karina Mason is a 34 y.o. G30P1001 female at 42w4dwith gestational dabetes not well controlled on diet. Reactive NST Bishop score=6  Plan:  1. DIscussed pros and cons of induction vs continued observation. Explained risks of induction to include fetal intolerance, failure to progress, hyperstimulation and Cesarean section. Patient also aware of risks of uncontrolled GDM including stillbirth, and fetal macrosomia which increases the risk of CPD, shoulder dystocia, lacerations and Cesarean section. Patient wishes to proceed with scheduling IOL. IOL scheduled for 8 March at 1500. 2. Plan on Cytotec induction   3. CBC, T&S, RPR, Check FSBS in labor 2 hr PP while on diet, then every 1 hour when in active labor. Can cover with insulin [place], then Endo tool when needed.  4. GBS negative.   5. Breast 6. IUD 7. TDAP/ flu vaccine-not given AP 8. At risk for pp depression   CDalia Heading 02/27/2020 10:23 PM

## 2020-02-28 ENCOUNTER — Inpatient Hospital Stay
Admission: EM | Admit: 2020-02-28 | Discharge: 2020-03-02 | DRG: 807 | Disposition: A | Discharge status: Home / Self Care | Payer: 59 | Attending: Obstetrics & Gynecology | Admitting: Obstetrics & Gynecology

## 2020-02-28 ENCOUNTER — Other Ambulatory Visit: Payer: Self-pay

## 2020-02-28 ENCOUNTER — Encounter: Payer: Self-pay | Admitting: Obstetrics and Gynecology

## 2020-02-28 DIAGNOSIS — O0993 Supervision of high risk pregnancy, unspecified, third trimester: Secondary | ICD-10-CM

## 2020-02-28 DIAGNOSIS — Z20822 Contact with and (suspected) exposure to covid-19: Secondary | ICD-10-CM | POA: Diagnosis present

## 2020-02-28 DIAGNOSIS — O24429 Gestational diabetes mellitus in childbirth, unspecified control: Principal | ICD-10-CM | POA: Diagnosis present

## 2020-02-28 DIAGNOSIS — O99214 Obesity complicating childbirth: Secondary | ICD-10-CM | POA: Diagnosis not present

## 2020-02-28 DIAGNOSIS — O26893 Other specified pregnancy related conditions, third trimester: Secondary | ICD-10-CM | POA: Diagnosis present

## 2020-02-28 DIAGNOSIS — Z3A38 38 weeks gestation of pregnancy: Secondary | ICD-10-CM

## 2020-02-28 DIAGNOSIS — Z8632 Personal history of gestational diabetes: Secondary | ICD-10-CM | POA: Diagnosis present

## 2020-02-28 DIAGNOSIS — O24419 Gestational diabetes mellitus in pregnancy, unspecified control: Secondary | ICD-10-CM | POA: Diagnosis present

## 2020-02-28 LAB — CBC
HCT: 35 % — ABNORMAL LOW (ref 36.0–46.0)
Hemoglobin: 11.5 g/dL — ABNORMAL LOW (ref 12.0–15.0)
MCH: 31 pg (ref 26.0–34.0)
MCHC: 32.9 g/dL (ref 30.0–36.0)
MCV: 94.3 fL (ref 80.0–100.0)
Platelets: 212 10*3/uL (ref 150–400)
RBC: 3.71 MIL/uL — ABNORMAL LOW (ref 3.87–5.11)
RDW: 13.6 % (ref 11.5–15.5)
WBC: 9.1 10*3/uL (ref 4.0–10.5)
nRBC: 0 % (ref 0.0–0.2)

## 2020-02-28 LAB — TYPE AND SCREEN
ABO/RH(D): A POS
Antibody Screen: NEGATIVE

## 2020-02-28 LAB — RESPIRATORY PANEL BY RT PCR (FLU A&B, COVID)
Influenza A by PCR: NEGATIVE
Influenza B by PCR: NEGATIVE
SARS Coronavirus 2 by RT PCR: NEGATIVE

## 2020-02-28 LAB — ABO/RH: ABO/RH(D): A POS

## 2020-02-28 LAB — GLUCOSE, CAPILLARY
Glucose-Capillary: 90 mg/dL (ref 70–99)
Glucose-Capillary: 77 mg/dL (ref 70–99)

## 2020-02-28 MED ORDER — MISOPROSTOL 25 MCG QUARTER TABLET
25.0000 ug | ORAL_TABLET | Freq: Once | ORAL | Status: AC
  Administered 2020-02-28: 25 ug via BUCCAL
  Filled 2020-02-28: qty 1

## 2020-02-28 MED ORDER — TERBUTALINE SULFATE 1 MG/ML IJ SOLN: 0.2500 mg | Freq: Once | INTRAMUSCULAR | Status: DC | PRN

## 2020-02-28 MED ORDER — ACETAMINOPHEN 325 MG PO TABS
650.0000 mg | ORAL_TABLET | ORAL | Status: DC | PRN
  Administered 2020-03-01 – 2020-03-02 (×2): 650 mg via ORAL
  Filled 2020-02-28 (×2): qty 2

## 2020-02-28 MED ORDER — OXYTOCIN BOLUS FROM INFUSION
500.0000 mL | Freq: Once | INTRAVENOUS | Status: AC
  Administered 2020-02-29: 500 mL via INTRAVENOUS

## 2020-02-28 MED ORDER — LIDOCAINE HCL (PF) 1 % IJ SOLN: 30.0000 mL | INTRAMUSCULAR | Status: DC | PRN

## 2020-02-28 MED ORDER — ZOLPIDEM TARTRATE 5 MG PO TABS
5.0000 mg | ORAL_TABLET | Freq: Once | ORAL | Status: AC
  Administered 2020-02-28: 5 mg via ORAL
  Filled 2020-02-28: qty 1

## 2020-02-28 MED ORDER — LIDOCAINE HCL (PF) 1 % IJ SOLN
INTRAMUSCULAR | Status: AC
  Filled 2020-02-28: qty 30

## 2020-02-28 MED ORDER — BUTORPHANOL TARTRATE 1 MG/ML IJ SOLN
1.0000 mg | INTRAMUSCULAR | Status: DC | PRN
  Administered 2020-02-29 (×3): 1 mg via INTRAVENOUS
  Filled 2020-02-28 (×3): qty 1

## 2020-02-28 MED ORDER — OXYTOCIN 40 UNITS IN NORMAL SALINE INFUSION - SIMPLE MED
2.5000 [IU]/h | INTRAVENOUS | Status: DC
  Administered 2020-02-29 (×2): 2.5 [IU]/h via INTRAVENOUS

## 2020-02-28 MED ORDER — OXYTOCIN 10 UNIT/ML IJ SOLN
INTRAMUSCULAR | Status: AC
  Filled 2020-02-28: qty 2

## 2020-02-28 MED ORDER — AMMONIA AROMATIC IN INHA
RESPIRATORY_TRACT | Status: AC
  Filled 2020-02-28: qty 10

## 2020-02-28 MED ORDER — ONDANSETRON HCL 4 MG/2ML IJ SOLN: 4.0000 mg | Freq: Four times a day (QID) | INTRAMUSCULAR | Status: DC | PRN

## 2020-02-28 MED ORDER — OXYTOCIN 40 UNITS IN NORMAL SALINE INFUSION - SIMPLE MED
INTRAVENOUS | Status: AC
  Filled 2020-02-28: qty 1000

## 2020-02-28 MED ORDER — MISOPROSTOL 100 MCG PO TABS
25.0000 ug | ORAL_TABLET | ORAL | Status: DC | PRN
  Administered 2020-02-28 – 2020-02-29 (×2): 25 ug via VAGINAL
  Filled 2020-02-28 (×4): qty 1

## 2020-02-28 MED ORDER — LACTATED RINGERS IV SOLN: 500.0000 mL | INTRAVENOUS | Status: DC | PRN

## 2020-02-28 MED ORDER — MISOPROSTOL 200 MCG PO TABS
ORAL_TABLET | ORAL | Status: AC
  Administered 2020-02-28: 25 ug via VAGINAL
  Filled 2020-02-28: qty 4

## 2020-02-28 MED ORDER — LACTATED RINGERS IV SOLN: INTRAVENOUS | Status: DC

## 2020-02-28 NOTE — Telephone Encounter (Signed)
Called pt this morning, apologized for not getting back with her sooner. Pt very understanding and just wanted to let me know she was being induced today so the date would need to be changed. Pt asked if I could make a copy so someone could get it because she originally wanted it faxed to work so that she could get it from there. I stated that would be perfectly fine and then she asked if I could still fax it to her work right now since she was at work for about 30 mins to get some things. I have faxed her papers and made a copy now.

## 2020-02-28 NOTE — H&P (Signed)
Updated H&P  History and Physical Interval Note:  02/28/2020 7:24 PM   Vital Signs: BP 130/65   Pulse 82   Temp 98.4 F (36.9 C) (Oral)   Resp 16   Ht 5\' 9"  (1.753 m)   Wt 120.7 kg   LMP 06/07/2019 (Exact Date)   BMI 39.28 kg/m  Constitutional: Well nourished, well developed female in no acute distress.  HEENT: normal Skin: Warm and dry.  Cardiovascular: Regular rate and rhythm.   Extremity: trace edema  Respiratory: Clear to auscultation bilateral. Normal respiratory effort Abdomen: FHT present Back: no CVAT Neuro: DTRs 2+, Cranial nerves grossly intact Psych: Alert and Oriented x3. No memory deficits. Normal mood and affect.  MS: normal gait, normal bilateral lower extremity ROM/strength/stability.  Pelvic exam: per RN exam 1/40/-1  Toco: occasional contractions Fetal well being: 150 bpm moderate variability, +accelerations, -decelerations   Karina Mason  has presented today for INDUCTION OF LABOR (cervical ripening agents),  with the diagnosis of A2 DM. The various methods of treatment have been discussed with the patient and family. After consideration of risks, benefits and other options for treatment, the patient has consented to  Labor induction .  The patient's history has been reviewed, patient examined, no change in status, and is stable for induction as planned.  See H&P and admission orders. I have reviewed the patient's chart and labs.  Questions were answered to the patient's satisfaction.     Mariann Laster, CNM [facility] 02/28/2020  7:24 PM

## 2020-02-29 ENCOUNTER — Inpatient Hospital Stay: Payer: 59 | Admitting: Anesthesiology

## 2020-02-29 DIAGNOSIS — O99214 Obesity complicating childbirth: Secondary | ICD-10-CM

## 2020-02-29 DIAGNOSIS — O24429 Gestational diabetes mellitus in childbirth, unspecified control: Secondary | ICD-10-CM

## 2020-02-29 LAB — GLUCOSE, CAPILLARY
Glucose-Capillary: 102 mg/dL — ABNORMAL HIGH (ref 70–99)
Glucose-Capillary: 110 mg/dL — ABNORMAL HIGH (ref 70–99)
Glucose-Capillary: 100 mg/dL — ABNORMAL HIGH (ref 70–99)
Glucose-Capillary: 82 mg/dL (ref 70–99)

## 2020-02-29 LAB — RPR: RPR Ser Ql: NONREACTIVE

## 2020-02-29 MED ORDER — WITCH HAZEL-GLYCERIN EX PADS: 1.0000 "application " | MEDICATED_PAD | CUTANEOUS | Status: DC | PRN

## 2020-02-29 MED ORDER — ONDANSETRON HCL 4 MG PO TABS: 4.0000 mg | ORAL_TABLET | ORAL | Status: DC | PRN

## 2020-02-29 MED ORDER — COCONUT OIL OIL
1.0000 "application " | TOPICAL_OIL | Status: DC | PRN
  Administered 2020-03-02: 1 via TOPICAL
  Filled 2020-02-29: qty 120

## 2020-02-29 MED ORDER — LIDOCAINE HCL (PF) 1 % IJ SOLN
INTRAMUSCULAR | Status: DC | PRN
  Administered 2020-02-29: 3 mL via SUBCUTANEOUS

## 2020-02-29 MED ORDER — DIPHENHYDRAMINE HCL 25 MG PO CAPS: 25.0000 mg | ORAL_CAPSULE | Freq: Four times a day (QID) | ORAL | Status: DC | PRN

## 2020-02-29 MED ORDER — ACETAMINOPHEN 325 MG PO TABS: 650.0000 mg | ORAL_TABLET | ORAL | Status: DC | PRN

## 2020-02-29 MED ORDER — IBUPROFEN 600 MG PO TABS
600.0000 mg | ORAL_TABLET | Freq: Four times a day (QID) | ORAL | Status: DC
  Administered 2020-02-29 – 2020-03-02 (×6): 600 mg via ORAL
  Filled 2020-02-29 (×7): qty 1

## 2020-02-29 MED ORDER — SODIUM CHLORIDE 0.9 % IV SOLN: 250.0000 mL | INTRAVENOUS | Status: DC | PRN

## 2020-02-29 MED ORDER — ONDANSETRON HCL 4 MG/2ML IJ SOLN: 4.0000 mg | INTRAMUSCULAR | Status: DC | PRN

## 2020-02-29 MED ORDER — BENZOCAINE-MENTHOL 20-0.5 % EX AERO: 1.0000 "application " | INHALATION_SPRAY | CUTANEOUS | Status: DC | PRN

## 2020-02-29 MED ORDER — BUPIVACAINE HCL (PF) 0.25 % IJ SOLN
INTRAMUSCULAR | Status: DC | PRN
  Administered 2020-02-29: 3 mL via EPIDURAL
  Administered 2020-02-29: 4 mL via EPIDURAL

## 2020-02-29 MED ORDER — DIBUCAINE (PERIANAL) 1 % EX OINT: 1.0000 "application " | TOPICAL_OINTMENT | CUTANEOUS | Status: DC | PRN

## 2020-02-29 MED ORDER — FENTANYL 2.5 MCG/ML W/ROPIVACAINE 0.15% IN NS 100 ML EPIDURAL (ARMC)
EPIDURAL | Status: AC
  Filled 2020-02-29: qty 100

## 2020-02-29 MED ORDER — LIDOCAINE-EPINEPHRINE (PF) 1.5 %-1:200000 IJ SOLN
INTRAMUSCULAR | Status: DC | PRN
  Administered 2020-02-29: 3 mL via EPIDURAL

## 2020-02-29 MED ORDER — FENTANYL 2.5 MCG/ML W/ROPIVACAINE 0.15% IN NS 100 ML EPIDURAL (ARMC)
EPIDURAL | Status: DC | PRN
  Administered 2020-02-29: 12 mL/h via EPIDURAL

## 2020-02-29 MED ORDER — SIMETHICONE 80 MG PO CHEW: 80.0000 mg | CHEWABLE_TABLET | ORAL | Status: DC | PRN

## 2020-02-29 MED ORDER — SENNOSIDES-DOCUSATE SODIUM 8.6-50 MG PO TABS
2.0000 | ORAL_TABLET | ORAL | Status: DC
  Filled 2020-02-29 (×2): qty 2

## 2020-02-29 MED ORDER — OXYTOCIN 40 UNITS IN NORMAL SALINE INFUSION - SIMPLE MED
1.0000 m[IU]/min | INTRAVENOUS | Status: DC
  Administered 2020-02-29: 08:00:00 2 m[IU]/min via INTRAVENOUS

## 2020-02-29 MED ORDER — ZOLPIDEM TARTRATE 5 MG PO TABS: 5.0000 mg | ORAL_TABLET | Freq: Every evening | ORAL | Status: DC | PRN

## 2020-02-29 MED ORDER — SODIUM CHLORIDE 0.9% FLUSH: 3.0000 mL | INTRAVENOUS | Status: DC | PRN

## 2020-02-29 MED ORDER — SODIUM CHLORIDE 0.9% FLUSH: 3.0000 mL | Freq: Two times a day (BID) | INTRAVENOUS | Status: DC

## 2020-02-29 NOTE — Discharge Summary (Signed)
OB Discharge Summary     Patient Name: Karina Mason DOB: [birth year] MRN: [id number]  Date of admission: 02/28/2020 Delivering MD: Hoyt Koch, MD  Date of Delivery: 02/29/2020  Date of discharge: 03/02/2020  Admitting diagnosis: Labor and delivery, indication for care [O75.9] Intrauterine pregnancy: [redacted]w[redacted]d    Secondary diagnosis: Gestational Diabetes medication controlled (A2)     Discharge diagnosis: [facility] Pregnancy Del[facility], [facility]  Induction of Labor With Vaginal Delivery   34y.o. yo G2P1001 at 360w1das admitted to the hospital 02/28/2020 for induction of labor.  Indication for induction: A2 DM.  Patient had an uncomplicated labor course as follows: Membrane Rupture Time/Date: 12:27 PM ,02/29/2020   Intrapartum Procedures: Episiotomy: None [1]                                         Lacerations:  1st degree [2]  Patient had delivery of a Viable infant.  Information for the patient's newborn:  Karina Mason, Karina Mason[[id number]]Delivery Method: Vag-Spont    02/29/2020  Details of delivery can be found in separate delivery note.  Patient had a routine postpartum course. Patient is discharged home 03/02/20.                                                                 Post partum procedures:none  Complications: None  Physical exam on 03/02/2020: Vitals:   03/01/20 1158 03/01/20 1626 03/02/20 0045 03/02/20 0755  BP: 121/84 108/64 120/66 109/60  Pulse: 76 68 62 61  Resp: 18 20  20   Temp: 98.4 F (36.9 C) 98.3 F (36.8 C) 98.7 F (37.1 C) 98.2 F (36.8 C)  TempSrc: Oral  Oral Oral  SpO2: 98% 99% 100% 99%  Weight:      Height:       General: alert, cooperative and no distress Lochia: appropriate Uterine Fundus: firm DVT Evaluation: No evidence of DVT seen on physical exam.  Labs: Lab Results  Component Value Date   WBC 13.9 (H) 03/01/2020   HGB 11.9 (L) 03/01/2020   HCT 35.0 (L) 03/01/2020   MCV 91.9 03/01/2020   PLT  230 03/01/2020   No flowsheet data found.  Discharge instruction: per After Visit Summary.  Medications:  Allergies as of 03/02/2020      Reactions   Shellfish Allergy Shortness Of Breath      Medication List    STOP taking these medications   Comfort EZ Pen Needles 31G X 5 MM Misc Generic drug: Insulin Pen Needle   Lantus SoloStar 100 UNIT/ML Solostar Pen Generic drug: insulin glargine     TAKE these medications   Accu-Chek FastClix Lancets Misc 1 Units by Percutaneous route 4 (four) times daily.   Accu-Chek Nano SmartView w/Device Kit 1 kit by Subdermal route as directed. Check blood sugars for fasting, and two hours after breakfast, lunch and dinner (4 checks daily)   Accu-Chek SmartView test strip Generic drug: glucose blood Use as instructed to check blood sugars   acetaminophen 500 MG  tablet Commonly known as: TYLENOL Take 2 tablets (1,000 mg total) by mouth every 6 (six) hours as needed.   Concept OB 130-92.4-1 MG Caps Take 1 capsule by mouth daily.   ibuprofen 600 MG tablet Commonly known as: ADVIL Take 1 tablet (600 mg total) by mouth every 6 (six) hours.       Diet: routine diet  Activity: Advance as tolerated. Pelvic rest for 6 weeks.   Outpatient follow up: Follow-up Information    Gae Dry, MD. Schedule an appointment as soon as possible for a visit in 6 day(s).   Specialty: Obstetrics and Gynecology Contact information: [address] [place] [address] [phone]             Postpartum contraception: IUD Mirena Rhogam Given postpartum: no Rubella vaccine given postpartum: no Varicella vaccine given postpartum: no TDaP given antepartum or postpartum: offered at discharge  Newborn Data: Live born female  Birth Weight:   APGAR: 66, 9  Newborn Delivery   Birth date/time: 02/29/2020 16:33:00 Delivery type: Vaginal, Spontaneous       Baby Feeding: breast and bottle  Disposition:home with  mother  SIGNED: Malachy Mood, MD 03/02/2020 8:31 AM

## 2020-02-29 NOTE — Progress Notes (Addendum)
  Labor Progress Note   34 y.o. G2P1001 @ [redacted]w[redacted]d , admitted for  Pregnancy, Labor Management.   Subjective:  Patient feels occasional contractions.  Objective:  BP 123/73   Pulse 63   Temp 97.8 F (36.6 C) (Oral)   Resp 18   Ht 5\' 9"  (1.753 m)   Wt 120.7 kg   LMP 06/07/2019 (Exact Date)   BMI 39.28 kg/m  Abd: gravid, ND, FHT present, mild tenderness on exam Extr: trace edema SVE: CERVIX: 3 cm dilated, 70 effaced, -2 station Cervical sweep  EFM: FHR: 135 bpm, variability: moderate,  accelerations:  Present,  decelerations:  Absent Toco: Frequency: Every 1-6 minutes Labs: I have reviewed the patient's lab results.   Assessment & Plan:  G2P1001 @ [redacted]w[redacted]d, admitted for  Pregnancy and Labor/Delivery Management  1. Pain management: none. 2. FWB: FHT category I.  3. ID: GBS negative 4. Labor management: start pitocin  All discussed with patient, see orders   [redacted]w[redacted]d, CNM [facility] 02/29/2020  7:46 AM

## 2020-02-29 NOTE — Anesthesia Preprocedure Evaluation (Signed)
Anesthesia Evaluation  Patient identified by MRN, date of birth, ID band Patient awake    Reviewed: Allergy & Precautions, H&P , NPO status , Patient's Chart, lab work & pertinent test results  History of Anesthesia Complications Negative for: history of anesthetic complications  Airway Mallampati: II       Dental no notable dental hx. (+) Teeth Intact   Pulmonary           Cardiovascular      Neuro/Psych PSYCHIATRIC DISORDERS Depression    GI/Hepatic   Endo/Other  diabetes, Well Controlled, Gestational  Renal/GU      Musculoskeletal   Abdominal   Peds  Hematology   Anesthesia Other Findings   Reproductive/Obstetrics (+) Pregnancy                             Anesthesia Physical Anesthesia Plan  ASA: II  Anesthesia Plan: Epidural   Post-op Pain Management:    Induction:   PONV Risk Score and Plan:   Airway Management Planned:   Additional Equipment:   Intra-op Plan:   Post-operative Plan:   Informed Consent: I have reviewed the patients History and Physical, chart, labs and discussed the procedure including the risks, benefits and alternatives for the proposed anesthesia with the patient or authorized representative who has indicated his/her understanding and acceptance.       Plan Discussed with: Anesthesiologist  Anesthesia Plan Comments:         Anesthesia Quick Evaluation

## 2020-02-29 NOTE — Anesthesia Procedure Notes (Addendum)
Epidural Patient location during procedure: OB Start time: 02/29/2020 10:56 AM End time: 02/29/2020 10:59 AM  Staffing Anesthesiologist: Yevette Edwards, MD Resident/CRNA: Irving Burton, CRNA Performed: resident/CRNA   Preanesthetic Checklist Completed: patient identified, IV checked, site marked, risks and benefits discussed, surgical consent, monitors and equipment checked and pre-op evaluation  Epidural Patient position: sitting Prep: ChloraPrep and site prepped and draped Patient monitoring: heart rate, continuous pulse ox and blood pressure Approach: midline Location: L3-L4 Injection technique: LOR saline  Needle:  Needle type: Tuohy  Needle gauge: 17 G Needle length: 9 cm Needle insertion depth: 7 cm Catheter type: closed end flexible Catheter size: 19 Gauge Catheter at skin depth: 12 cm Test dose: negative and 1.5% lidocaine with Epi 1:200 K  Assessment Events: blood not aspirated, injection not painful, no injection resistance, no paresthesia and negative IV test  Additional Notes Reason for block:procedure for pain

## 2020-02-29 NOTE — Progress Notes (Signed)
  Labor Progress Note   34 y.o. G2P1001 @ [redacted]w[redacted]d , admitted for  Pregnancy, Labor Management.   Subjective:  IOL for GDM, no Insulin required in pregnancy or in labor as of yet Mild pain w ctxs currently  Objective:  BP 123/73   Pulse 63   Temp 97.8 F (36.6 C) (Oral)   Resp 18   Ht 5\' 9"  (1.753 m)   Wt 120.7 kg   LMP 06/07/2019 (Exact Date)   BMI 39.28 kg/m  Abd: gravid, ND, FHT present, mild tenderness on exam Extr: trace to 1+ bilateral pedal edema  EFM: FHR: 150 bpm, variability: moderate,  accelerations:  Present,  decelerations:  Absent Toco: Frequency: Every 4-7 minutes Labs: I have reviewed the patient's lab results.   Assessment & Plan:  G2P1001 @ [redacted]w[redacted]d, admitted for  Pregnancy and Labor/Delivery Management  1. Pain management: none. 2. FWB: FHT category 1.  3. ID: GBS negative 4. Labor management: Pitocin, positioning, work w pt for active [redacted]w[redacted]d (last one 103) Plans to breast feed  All discussed with patient, see orders  Art therapist, MD, [facility], [facility] 02/29/2020  8:56 AM

## 2020-02-29 NOTE — Progress Notes (Signed)
  Labor Progress Note   34 y.o. G2P1001 @ [redacted]w[redacted]d , admitted for  Pregnancy, Labor Management.   Subjective:  Pain improved w epidural  Objective:  BP 122/62   Pulse 75   Temp 97.8 F (36.6 C) (Oral)   Resp 18   Ht 5\' 9"  (1.753 m)   Wt 120.7 kg   LMP 06/07/2019 (Exact Date)   SpO2 98%   BMI 39.28 kg/m  Abd: gravid, ND, FHT present, mild tenderness on exam Extr: trace to 1+ bilateral pedal edema SVE: CERVIX: 5 cm dilated, 70 effaced, -3 station, presenting part Vtx MEMBRANES: ruptured, clear fluid  EFM: FHR: 140 bpm, variability: moderate,  accelerations:  Present,  decelerations:  Absent Toco: Frequency: Every 2-3 minutes Labs: I have reviewed the patient's lab results.   Assessment & Plan:  G2P1001 @ [redacted]w[redacted]d, admitted for  Pregnancy and Labor/Delivery Management  1. Pain management: epidural. 2. FWB: FHT category 1.  3. ID: GBS negative 4. Labor management: AROM clear  Cont Pitocin (at 16 mU/min curently)  All discussed with patient, see orders  [redacted]w[redacted]d, MD, [facility], [facility] 02/29/2020  12:29 PM

## 2020-03-01 ENCOUNTER — Telehealth: Payer: Self-pay

## 2020-03-01 LAB — GLUCOSE, CAPILLARY
Glucose-Capillary: 163 mg/dL — ABNORMAL HIGH (ref 70–99)
Glucose-Capillary: 135 mg/dL — ABNORMAL HIGH (ref 70–99)
Glucose-Capillary: 111 mg/dL — ABNORMAL HIGH (ref 70–99)

## 2020-03-01 LAB — CBC
HCT: 35 % — ABNORMAL LOW (ref 36.0–46.0)
Hemoglobin: 11.9 g/dL — ABNORMAL LOW (ref 12.0–15.0)
MCH: 31.2 pg (ref 26.0–34.0)
MCHC: 34 g/dL (ref 30.0–36.0)
MCV: 91.9 fL (ref 80.0–100.0)
Platelets: 230 10*3/uL (ref 150–400)
RBC: 3.81 MIL/uL — ABNORMAL LOW (ref 3.87–5.11)
RDW: 13.2 % (ref 11.5–15.5)
WBC: 13.9 10*3/uL — ABNORMAL HIGH (ref 4.0–10.5)
nRBC: 0 % (ref 0.0–0.2)

## 2020-03-01 NOTE — Progress Notes (Signed)
Pt's blood sugar 163 at 0618. Pt stated she had ate "a bag of chips and cookies a little bit earlier".   Will let next shift nurse know to pass along.

## 2020-03-01 NOTE — Lactation Note (Signed)
This note was copied from a baby's chart. Lactation Consultation Note  Patient Name: Karina Mason XNATF'T Date: 03/01/2020 Reason for consult: Initial assessment;1st time breastfeeding  LC in to see mom and new [name]. This is mom's second baby, but her first time breastfeeding. Mom was GDM, and baby has had continued problems with low BS levels, latest at 10am of 31. Current feeding plan is offering breast for 15 minutes and giving 37mL of formula [facility]) supplement.  [facility] student assisted with waking baby, getting baby alert, and bringing baby to mom. [facility] student directed and educated mom on transition from cradle hold to cross-cradle for better alignment of baby and control of baby with movement and breast support. Baby latched after a few attempts and with continual stimulation fed for 15 minutes. Mom was educated and taught hand expression and was able to demonstrate hand expression on herself. Post breastfeed, LC student finger fed baby 70mL of formula, giving guidance for mom for feeding next time.  Plan for today will be to test baby prior to next feed, and continue with feeding support at the breast, and assistance if needed with supplement finger feeding.  Mom was given the option to begin pumping to help with breast stimulation and colostrum removal, educated on milk supply and demand, and empty breasts make more milk, however mom feels at this time that may be a little overwhelming, but she will consider. Mom encouraged to let [facility] and [facility] student know if she changes her mind for pump education/implementation.  Maternal Data Formula Feeding for Exclusion: No Has patient been taught Hand Expression?: Yes Does the patient have breastfeeding experience prior to this delivery?: No(Did not breastfeed her 34yr old)  Feeding Feeding Type: Breast Fed  LATCH Score Latch: Repeated attempts needed to sustain latch, nipple held in mouth throughout feeding, stimulation needed to elicit sucking  reflex.  Audible Swallowing: None  Type of Nipple: Everted at rest and after stimulation  Comfort (Breast/Nipple): Soft / non-tender  Hold (Positioning): Assistance needed to correctly position infant at breast and maintain latch.  LATCH Score: 6  Interventions Interventions: Breast feeding basics reviewed;Assisted with latch;Breast massage;Hand express;Breast compression;Adjust position;Support pillows;Position options  Lactation Tools Discussed/Used     Consult Status Consult Status: Follow-up Date: 03/01/20 Follow-up type: In-patient    Danford Bad 03/01/2020, 10:45 AM

## 2020-03-01 NOTE — Lactation Note (Signed)
This note was copied from a baby's chart. Lactation Consultation Note  Patient Name: Karina Mason Date: 03/01/2020 Reason for consult: Follow-up assessment  Upon entering Nurse Zella Ball was checking babies blood sugar level which had gone down since last feed. LC student prepared of formula to be fed with the bottle. MOB got emotional with the blood sugar levels and LC and LC student stepped in with words of commfort. MOB fed the baby the bottle of formula and baby actively fed. LC student reassured MOB that her feelings are valid and that we will create a plan that works best for her and her baby.  Nurse Zella Ball returned from talking with the pediatrician who said they wanted to take baby to SCN. This made MOB emotional but nurse explained what would happen to baby in SCN. Nurse Zella Ball mentioned that pumping initiation would start soon and MOB nodded confirmation.  MOB was not prepared for all these new hurdles, but was reassured that we are going to assist as best we can to accomplish her goals.  Maternal Data Does the patient have breastfeeding experience prior to this delivery?: No  Feeding Feeding Type: Bottle Fed - Formula Nipple Type: Slow - flow  LATCH Score Latch: Repeated attempts needed to sustain latch, nipple held in mouth throughout feeding, stimulation needed to elicit sucking reflex.  Audible Swallowing: A few with stimulation  Type of Nipple: Flat  Comfort (Breast/Nipple): Soft / non-tender  Hold (Positioning): Assistance needed to correctly position infant at breast and maintain latch.  LATCH Score: 6  Interventions Interventions: Breast feeding basics reviewed;Breast compression;Assisted with latch;Adjust position;Skin to skin;Support pillows;Hand express  Lactation Tools Discussed/Used Tools: Bottle   Consult Status Consult Status: Follow-up Date: 03/01/20 Follow-up type: In-patient    [name] [facility] 03/01/2020, 4:06 PM

## 2020-03-01 NOTE — Anesthesia Postprocedure Evaluation (Signed)
Anesthesia Post Note  Patient: Karina Mason  Procedure(s) Performed: AN AD HOC LABOR EPIDURAL  Patient location during evaluation: Mother Baby Anesthesia Type: Epidural Level of consciousness: oriented and awake and alert Pain management: pain level controlled Vital Signs Assessment: post-procedure vital signs reviewed and stable Respiratory status: spontaneous breathing and respiratory function stable Cardiovascular status: blood pressure returned to baseline and stable Postop Assessment: no headache, no backache, no apparent nausea or vomiting and able to ambulate Anesthetic complications: no     Last Vitals:  Vitals:   02/29/20 2324 03/01/20 0304  BP: 112/66 104/63  Pulse: 67 85  Resp: 18 18  Temp: 36.4 C 36.6 C  SpO2: 100% 100%    Last Pain:  Vitals:   03/01/20 0615  TempSrc:   PainSc: 0-No pain                 Starling Manns

## 2020-03-01 NOTE — Progress Notes (Signed)
Post Partum Day 1 Subjective: up ad lib, voiding and tolerating PO. Some cramping, especially with breast feeding. Bleeding slowing somewhat.  Baby was being supplemented with finger feeds for hypoglycemia. Not ready for discharge today.  Objective: Blood pressure (!) 100/50, pulse 74, temperature 98.4 F (36.9 C), temperature source Oral, resp. rate 18, height 5\' 9"  (1.753 m), weight 120.7 kg, last menstrual period 06/07/2019, SpO2 99 %.  Physical Exam:  General: alert, cooperative and no distress ; breast feeding baby currently Lochia: appropriate Uterine Fundus: firm/ U-1/ML/NT  DVT Evaluation: No evidence of DVT seen on physical exam.  Recent Labs    02/28/20 1730 03/01/20 0452  HGB 11.5* 11.9*  HCT 35.0* 35.0*  WBC 9.1 13.9*  PLT 212 230   FSBS this AM was 163.   Assessment/Plan: Stable PPD #1 -continue postpartum care GDM-elevated FSBS this Am (was not fasting)-will monitor blood sugars qid  Get 2 hour GTT at 6 weeks postpartum A POS/ RI/ VI Breast-support breast feeding Contraception: IUD TDAP-not given AP, offer prior to discharge Hx of anxiety and depression-offer PPX prior to discharge and bring back for mood check in 2 weeks Plan for discharge tomorrow   LOS: 2 days   05/01/20 03/01/2020, 11:39 AM

## 2020-03-01 NOTE — Lactation Note (Signed)
This note was copied from a baby's chart. Lactation Consultation Note  Patient Name: Karina Mason URKYH'C Date: 03/01/2020 Reason for consult: Follow-up assessment  Upon entering the room, Nurse Zella Ball was finishing up on testing the sugars. MOB had baby latched on left breast in cross cradle. After about 10 minutes at the breast with constant stimulation baby was still not actively feeding. MOB finger fed and Nurse Zella Ball reported that Pediatrician said baby could go up 5-78mLs. MOB finished the first and then fed 5 more with finger feeding. Baby was satiated and mom was content.  Nurse Zella Ball stated next feed should be no later than 3:30pm and MOB was prompted to call out to [facility] once baby started cueing or when it was time for the next feeding.  Maternal Data Formula Feeding for Exclusion: No Has patient been taught Hand Expression?: Yes Does the patient have breastfeeding experience prior to this delivery?: No  Feeding Feeding Type: Breast Fed  LATCH Score Latch: Repeated attempts needed to sustain latch, nipple held in mouth throughout feeding, stimulation needed to elicit sucking reflex.  Audible Swallowing: A few with stimulation  Type of Nipple: Flat  Comfort (Breast/Nipple): Soft / non-tender  Hold (Positioning): Assistance needed to correctly position infant at breast and maintain latch.  LATCH Score: 6  Interventions Interventions: Breast feeding basics reviewed;Breast compression;Assisted with latch;Adjust position;Skin to skin;Support pillows;Hand express  Lactation Tools Discussed/Used     Consult Status Consult Status: Follow-up Date: 03/01/20 Follow-up type: In-patient    [name] [facility] 03/01/2020, 1:28 PM

## 2020-03-01 NOTE — Telephone Encounter (Signed)
Pt called yesterday; being induced at 3:30 yesterday; wanting to know if okay to eat; has side pain; okay to take tylenol.  [phone]  This msg was found as 'heard msg' on triage line.  Pt is currently admitted to [facility] L&D.

## 2020-03-01 NOTE — Anesthesia Postprocedure Evaluation (Signed)
Anesthesia Post Note  Patient: Karina Mason  Procedure(s) Performed: AN AD HOC LABOR EPIDURAL  Anesthesia Type: Epidural     Last Vitals:  Vitals:   03/01/20 0304 03/01/20 0810  BP: 104/63 (!) 102/52  Pulse: 85 74  Resp: 18 18  Temp: 36.6 C 36.9 C  SpO2: 100% 99%    Last Pain:  Vitals:   03/01/20 0810  TempSrc: Oral  PainSc:                  Karina Mason

## 2020-03-02 LAB — GLUCOSE, CAPILLARY
Glucose-Capillary: 94 mg/dL (ref 70–99)
Glucose-Capillary: 85 mg/dL (ref 70–99)

## 2020-03-02 MED ORDER — IBUPROFEN 600 MG PO TABS: 600.0000 mg | ORAL_TABLET | Freq: Four times a day (QID) | ORAL | 0 refills | Status: DC

## 2020-03-02 NOTE — Discharge Instructions (Signed)

## 2020-03-02 NOTE — Lactation Note (Signed)
This note was copied from a baby's chart. Lactation Consultation Note  Patient Name: Karina Mason OEHOZ'Y Date: 03/02/2020 Reason for consult: Follow-up assessment;NICU baby;Other (Comment)(low BS)  LC in to see mom before her discharge. Baby still in SCN, however BS levels have improved, and mom has been in a couple of times for feedings at the breast. Pump options given: pump rental, obtainment through [facility], or getting her own through purchasing at the store. Mom wants to discuss her options with her husband before making a decision. Mom did attempt to pump last night, but reports getting nothing, and after 1 hour it was too painful. LC re-educated mom on the importance of frequent stimulation of every 2-3 hours for only 15-20 minutes. Reviewed pump flange sizes, use of coconut oil to reduce friction between skin and plastic, and milk supply and demand. Mom does plan to visit [name] for at least 2 feedings a day, and LC gave instruction that otherwise she needs the ongoing stimulation. LC will follow-up with mom before her discharge regarding her pump decision.  Maternal Data Has patient been taught Hand Expression?: Yes Does the patient have breastfeeding experience prior to this delivery?: No  Feeding Feeding Type: Breast Fed(and bottle fed) Nipple Type: Slow - flow  LATCH Score Latch: Repeated attempts needed to sustain latch, nipple held in mouth throughout feeding, stimulation needed to elicit sucking reflex.  Audible Swallowing: A few with stimulation  Type of Nipple: Everted at rest and after stimulation  Comfort (Breast/Nipple): Soft / non-tender  Hold (Positioning): Assistance needed to correctly position infant at breast and maintain latch.  LATCH Score: 7  Interventions Interventions: Breast feeding basics reviewed;DEBP  Lactation Tools Discussed/Used Tools: Bottle WIC Program: Yes   Consult Status Consult Status: PRN Date: 03/02/20 Follow-up type: Call  as needed    Danford Bad 03/02/2020, 10:35 AM

## 2020-03-02 NOTE — Progress Notes (Signed)
Discharge order received from doctor. Rental breast pump sent home with patient. Reviewed discharge instructions and prescriptions with patient and answered all questions. Follow up appointment instructions given. Patient verbalized understanding. Patient discharged home (without infant; infant in SCN) via wheelchair by nursing/auxillary.    Oswald Hillock, RN

## 2020-03-02 NOTE — Lactation Note (Signed)
This note was copied from a baby's chart. Lactation Consultation Note  Patient Name: Karina Mason FIEPP'I Date: 03/02/2020 Reason for consult: Follow-up assessment;NICU baby;Other (Comment)(low BS)  Mom decided on pump rental from lactation at [facility]. Pump rental given, reviewed pump set-up, pump frequency, duration, and ongoing lactation support.  Maternal Data Has patient been taught Hand Expression?: Yes Does the patient have breastfeeding experience prior to this delivery?: No  Feeding Feeding Type: Bottle Fed - Formula Nipple Type: Slow - flow  LATCH Score                   Interventions Interventions: Breast feeding basics reviewed;DEBP  Lactation Tools Discussed/Used WIC Program: Yes   Consult Status Consult Status: PRN Date: 03/02/20 Follow-up type: Call as needed    Danford Bad 03/02/2020, 12:39 PM

## 2020-03-13 ENCOUNTER — Inpatient Hospital Stay: Admit: 2020-03-13 | Payer: 59

## 2020-03-14 ENCOUNTER — Ambulatory Visit: Payer: Self-pay

## 2020-03-14 NOTE — Lactation Note (Deleted)
This note was copied from a baby's chart. Lactation Consultation Note  Patient Name: Karina Mason OVFIE'P Date: 03/14/2020    [facility] student called in order to complete discharge follow-up call. MOB reported that baby has been having more formula and less breastmilk. MOB reported that she has not been "doing her part in breastfeeding" and is under a "lot of stress".   MOB had questions about when is it too late that her milk will "completely dry up" and LC student provided education on normal establishment period of breastfeeding, paced-bottle feeding, feeding at hunger cues, and supply and demand.   MOB knows to call, PRN.                 Carlena Hurl 03/14/2020, 10:18 AM

## 2020-03-17 ENCOUNTER — Other Ambulatory Visit: Payer: Self-pay

## 2020-03-17 ENCOUNTER — Ambulatory Visit (INDEPENDENT_AMBULATORY_CARE_PROVIDER_SITE_OTHER): Payer: 59 | Admitting: Obstetrics & Gynecology

## 2020-03-17 ENCOUNTER — Other Ambulatory Visit (HOSPITAL_COMMUNITY)
Admission: RE | Admit: 2020-03-17 | Discharge: 2020-03-17 | Disposition: A | Discharge status: Home / Self Care | Payer: 59 | Source: Ambulatory Visit | Attending: Obstetrics & Gynecology | Admitting: Obstetrics & Gynecology

## 2020-03-17 ENCOUNTER — Encounter: Payer: Self-pay | Admitting: Obstetrics & Gynecology

## 2020-03-17 VITALS — BP 100/60 | Ht 69.0 in | Wt 244.0 lb

## 2020-03-17 DIAGNOSIS — R102 Pelvic and perineal pain: Secondary | ICD-10-CM

## 2020-03-17 DIAGNOSIS — R350 Frequency of micturition: Secondary | ICD-10-CM

## 2020-03-17 LAB — POCT URINALYSIS DIPSTICK
Bilirubin, UA: NEGATIVE
Blood, UA: POSITIVE
Glucose, UA: NEGATIVE
Ketones, UA: NEGATIVE
Nitrite, UA: NEGATIVE
Protein, UA: NEGATIVE
Spec Grav, UA: 1.01 (ref 1.010–1.025)
Urobilinogen, UA: 0.2 E.U./dL
pH, UA: 5 (ref 5.0–8.0)

## 2020-03-17 NOTE — Progress Notes (Signed)
Gynecology Pelvic Pain Evaluation  History of Present Illness:   Patient is a 34 y.o. G2P1001 who is here for vaginal pain, odor, and urinary concerns 2 weeks after vaginal delivery and first degree repair.    Her pain is localized to the vaginal area, described as intermittent, began several days ago and its severity is described as moderate. The pain radiates to the  Non-radiating. She has these associated symptoms which include some odor at times; her bleeding and lochia is minimal. Patient has these modifiers which include relaxation and pain medication that make it better and unable to associate with any factor that make it worse.  Context includes: post partum.  PMHx: She  has a past medical history of Gestational diabetes and History of abnormal cervical Pap smear. Also,  has a past surgical history that includes Intrauterine device (iud) insertion., family history includes Diabetes in her father; Hypertension in her father and mother; Hypertension (age of onset: 60) in her brother and sister; Leukemia in her paternal grandfather; Other in her maternal grandfather; Stroke in her maternal grandmother.,  reports that she has never smoked. She has never used smokeless tobacco. She reports that she does not drink alcohol or use drugs.  She has a current medication list which includes the following prescription(s): accu-chek fastclix lancets, acetaminophen, accu-chek nano smartview, accu-chek smartview, ibuprofen, and concept ob. Also, is allergic to shellfish allergy.  Review of Systems  Constitutional: Negative for chills, fever and malaise/fatigue.  HENT: Negative for congestion, sinus pain and sore throat.   Eyes: Negative for blurred vision and pain.  Respiratory: Negative for cough and wheezing.   Cardiovascular: Negative for chest pain and leg swelling.  Gastrointestinal: Negative for abdominal pain, constipation, diarrhea, heartburn, nausea and vomiting.  Genitourinary: Negative for  dysuria, frequency, hematuria and urgency.  Musculoskeletal: Negative for back pain, joint pain, myalgias and neck pain.  Skin: Negative for itching and rash.  Neurological: Negative for dizziness, tremors and weakness.  Endo/Heme/Allergies: Does not bruise/bleed easily.  Psychiatric/Behavioral: Negative for depression. The patient is not nervous/anxious and does not have insomnia.     Objective: BP 100/60   Ht 5\' 9"  (1.753 m)   Wt 244 lb (110.7 kg)   BMI 36.03 kg/m  Physical Exam Constitutional:      General: She is not in acute distress.    Appearance: She is well-developed.  Genitourinary:     Pelvic exam was performed with patient supine.     Vagina and uterus normal.     No vaginal erythema or bleeding.     No cervical motion tenderness, discharge, polyp or nabothian cyst.     Uterus is mobile.     Uterus is not enlarged.     No uterine mass detected.    Uterus is midaxial.     No right or left adnexal mass present.     Right adnexa not tender.     Left adnexa not tender.     Genitourinary Comments: Vaginal/perineum healed from repair No vaginal foreign body or mass Cervix normal Uterus firm  HENT:     Head: Normocephalic and atraumatic.     Nose: Nose normal.  Abdominal:     General: There is no distension.     Palpations: Abdomen is soft.     Tenderness: There is no abdominal tenderness.  Musculoskeletal:        General: Normal range of motion.  Neurological:     Mental Status: She is alert and oriented  to person, place, and time.     Cranial Nerves: No cranial nerve deficit.  Skin:    General: Skin is warm and dry.  Psychiatric:        Attention and Perception: Attention normal.        Mood and Affect: Mood and affect normal.        Speech: Speech normal.        Behavior: Behavior normal.        Thought Content: Thought content normal.        Judgment: Judgment normal.    Female chaperone present for pelvic portion of the physical exam  Assessment:  34 y.o. G2P1001 with pain in the vagina.  1. Urine frequency - POCT urinalysis dipstick - likely contamination, no other signs of UTI  2. Vaginal pain No signs of infection, and stitch area healed Monitor over next few days  Annamarie Major, MD, [facility], [facility] 03/17/2020  4:57 PM

## 2020-03-20 ENCOUNTER — Encounter: Payer: Self-pay | Admitting: Advanced Practice Midwife

## 2020-03-21 ENCOUNTER — Other Ambulatory Visit: Payer: Self-pay | Admitting: Obstetrics & Gynecology

## 2020-03-21 LAB — CERVICOVAGINAL ANCILLARY ONLY
Bacterial Vaginitis (gardnerella): POSITIVE — AB
Candida Glabrata: NEGATIVE
Candida Vaginitis: NEGATIVE
Comment: NEGATIVE
Comment: NEGATIVE
Comment: NEGATIVE

## 2020-03-21 MED ORDER — METRONIDAZOLE 500 MG PO TABS: 500.0000 mg | ORAL_TABLET | Freq: Two times a day (BID) | ORAL | 0 refills | Status: DC

## 2020-04-14 ENCOUNTER — Other Ambulatory Visit: Payer: Self-pay

## 2020-04-14 ENCOUNTER — Encounter: Payer: Self-pay | Admitting: Obstetrics & Gynecology

## 2020-04-14 ENCOUNTER — Other Ambulatory Visit (HOSPITAL_COMMUNITY)
Admission: RE | Admit: 2020-04-14 | Discharge: 2020-04-14 | Disposition: A | Discharge status: Home / Self Care | Payer: 59 | Source: Ambulatory Visit | Attending: Obstetrics & Gynecology | Admitting: Obstetrics & Gynecology

## 2020-04-14 ENCOUNTER — Ambulatory Visit (INDEPENDENT_AMBULATORY_CARE_PROVIDER_SITE_OTHER): Payer: 59 | Admitting: Obstetrics & Gynecology

## 2020-04-14 VITALS — BP 110/60 | Ht 69.5 in | Wt 245.0 lb

## 2020-04-14 DIAGNOSIS — Z30430 Encounter for insertion of intrauterine contraceptive device: Secondary | ICD-10-CM | POA: Diagnosis not present

## 2020-04-14 DIAGNOSIS — Z124 Encounter for screening for malignant neoplasm of cervix: Secondary | ICD-10-CM | POA: Diagnosis present

## 2020-04-14 NOTE — Patient Instructions (Signed)
Intrauterine Device Insertion, Care After  This sheet gives you information about how to care for yourself after your procedure. Your health care provider may also give you more specific instructions. If you have problems or questions, contact your health care provider. What can I expect after the procedure? After the procedure, it is common to have:  Cramps and pain in the abdomen.  Light bleeding (spotting) or heavier bleeding that is like your menstrual period. This may last for up to a few days.  Lower back pain.  Dizziness.  Headaches.  Nausea. Follow these instructions at home:  Before resuming sexual activity, check to make sure that you can feel the IUD string(s). You should be able to feel the end of the string(s) below the opening of your cervix. If your IUD string is in place, you may resume sexual activity. ? If you had a hormonal IUD inserted more than 7 days after your most recent period started, you will need to use a backup method of birth control for 7 days after IUD insertion. Ask your health care provider whether this applies to you.  Continue to check that the IUD is still in place by feeling for the string(s) after every menstrual period, or once a month.  Take over-the-counter and prescription medicines only as told by your health care provider.  Do not drive or use heavy machinery while taking prescription pain medicine.  Keep all follow-up visits as told by your health care provider. This is important. Contact a health care provider if:  You have bleeding that is heavier or lasts longer than a normal menstrual cycle.  You have a fever.  You have cramps or abdominal pain that get worse or do not get better with medicine.  You develop abdominal pain that is new or is not in the same area of earlier cramping and pain.  You feel lightheaded or weak.  You have abnormal or bad-smelling discharge from your vagina.  You have pain during sexual  activity.  You have any of the following problems with your IUD string(s): ? The string bothers or hurts you or your sexual partner. ? You cannot feel the string. ? The string has gotten longer.  You can feel the IUD in your vagina.  You think you may be pregnant, or you miss your menstrual period.  You think you may have an STI (sexually transmitted infection). Get help right away if:  You have flu-like symptoms.  You have a fever and chills.  You can feel that your IUD has slipped out of place. Summary  After the procedure, it is common to have cramps and pain in the abdomen. It is also common to have light bleeding (spotting) or heavier bleeding that is like your menstrual period.  Continue to check that the IUD is still in place by feeling for the string(s) after every menstrual period, or once a month.  Keep all follow-up visits as told by your health care provider. This is important.  Contact your health care provider if you have problems with your IUD string(s), such as the string getting longer or bothering you or your sexual partner. This information is not intended to replace advice given to you by your health care provider. Make sure you discuss any questions you have with your health care provider. Document Revised: 11/21/2017 Document Reviewed: 10/30/2016 Elsevier Patient Education  2020 Elsevier Inc.  

## 2020-04-14 NOTE — Progress Notes (Signed)
  OBSTETRICS POSTPARTUM CLINIC PROGRESS NOTE  Subjective:     Karina Mason is a 34 y.o. G61P2002 female who presents for a postpartum visit. She is 6 weeks postpartum following a Term pregnancy or Uncomplicated pregnancy and delivery by Vaginal, no problems at delivery.  I have fully reviewed the prenatal and intrapartum course. Anesthesia: epidural.  Postpartum course has been complicated by uncomplicated.  Baby is feeding by Bottle.  Bleeding: patient has not  resumed menses.  Bowel function is normal. Bladder function is normal.  Patient is not sexually active. Contraception method desired is IUD.  Postpartum depression screening: negative. Edinburgh 4.  The following portions of the patient's history were reviewed and updated as appropriate: allergies, current medications, past family history, past medical history, past social history, past surgical history and problem list.  Review of Systems Pertinent items are noted in HPI.  Objective:    BP 110/60   Ht 5' 9.5" (1.765 m)   Wt 245 lb (111.1 kg)   LMP 04/05/2020   BMI 35.66 kg/m   General:  alert and no distress   Breasts:  inspection negative, no nipple discharge or bleeding, no masses or nodularity palpable  Lungs: clear to auscultation bilaterally  Heart:  regular rate and rhythm, S1, S2 normal, no murmur, click, rub or gallop  Abdomen: soft, non-tender; bowel sounds normal; no masses,  no organomegaly.     Vulva:  normal  Vagina: normal vagina, no discharge, exudate, lesion, or erythema  Cervix:  no cervical motion tenderness and no lesions  Corpus: normal size, contour, position, consistency, mobility, non-tender  Adnexa:  normal adnexa and no mass, fullness, tenderness  Rectal Exam: Not performed.          Assessment:  Post Partum Care visit 1. Encounter for insertion of mirena IUD See below  2. Postpartum care and examination  3. Screening for cervical cancer - Cytology - PAP   Plan:  See orders and  Patient Instructions Resume all normal activities Follow up in: 4 weeks or as needed.     IUD PROCEDURE NOTE:  Karina Mason is a 34 y.o. [id number] here for IUD insertion. No GYN concerns.  Last pap smear was normal.  Repeated today.  Postpartum.  IUD Insertion Procedure Note Patient identified, informed consent performed, consent signed.   Discussed risks of irregular bleeding, cramping, infection, malpositioning or misplacement of the IUD outside the uterus which may require further procedure such as laparoscopy, risk of failure <1%. Time out was performed.  Urine pregnancy test negative.  A bimanual exam showed the uterus to be midposition.  Speculum placed in the vagina.  Cervix visualized.  Cleaned with Betadine x 2.  Grasped anteriorly with a single tooth tenaculum.  Uterus sounded to 8 cm.   Mirena IUD placed per manufacturer's recommendations.  Strings trimmed to 3 cm. Tenaculum was removed, good hemostasis noted.  Patient tolerated procedure well.   Patient was given post-procedure instructions.  She was advised to have backup contraception for one week.  Patient was also asked to check IUD strings periodically and follow up in 4 weeks for IUD check.  Annamarie Major, MD, [facility], [facility] 04/14/2020  11:09 AM

## 2020-04-17 LAB — CYTOLOGY - PAP
Comment: NEGATIVE
Diagnosis: NEGATIVE
High risk HPV: NEGATIVE

## 2020-05-15 ENCOUNTER — Ambulatory Visit: Payer: 59 | Admitting: Obstetrics & Gynecology

## 2020-08-02 NOTE — Telephone Encounter (Signed)
Patient is scheduled for 08/03/20 with CLG

## 2020-08-03 ENCOUNTER — Encounter: Payer: Self-pay | Admitting: Certified Nurse Midwife

## 2020-08-03 ENCOUNTER — Other Ambulatory Visit: Payer: Self-pay

## 2020-08-03 ENCOUNTER — Ambulatory Visit (INDEPENDENT_AMBULATORY_CARE_PROVIDER_SITE_OTHER): Payer: 59 | Admitting: Certified Nurse Midwife

## 2020-08-03 VITALS — BP 131/71 | HR 65 | Ht 70.0 in | Wt 269.0 lb

## 2020-08-03 DIAGNOSIS — L292 Pruritus vulvae: Secondary | ICD-10-CM

## 2020-08-03 DIAGNOSIS — E669 Obesity, unspecified: Secondary | ICD-10-CM | POA: Diagnosis not present

## 2020-08-03 DIAGNOSIS — Z113 Encounter for screening for infections with a predominantly sexual mode of transmission: Secondary | ICD-10-CM | POA: Diagnosis not present

## 2020-08-03 DIAGNOSIS — N898 Other specified noninflammatory disorders of vagina: Secondary | ICD-10-CM

## 2020-08-03 DIAGNOSIS — R079 Chest pain, unspecified: Secondary | ICD-10-CM

## 2020-08-03 MED ORDER — CLOTRIMAZOLE-BETAMETHASONE 1-0.05 % EX CREA: 1.0000 "application " | TOPICAL_CREAM | Freq: Two times a day (BID) | CUTANEOUS | 0 refills | Status: AC | PRN

## 2020-08-03 MED ORDER — FLUCONAZOLE 150 MG PO TABS: ORAL_TABLET | ORAL | 0 refills | Status: AC

## 2020-08-05 ENCOUNTER — Encounter: Payer: Self-pay | Admitting: Certified Nurse Midwife

## 2020-08-05 NOTE — Progress Notes (Signed)
Obstetrics & Gynecology Office Visit   Chief Complaint:  Chief Complaint  Patient presents with  . Gynecologic Exam    vag discomfort; white in color    History of Present Illness: Karina Mason is a 34 year old G2 P2002 BF, LMP 07/16/2020, who presents with complaints of vaginal discomfort, vulvovaginal itching and vaginal odor for  1 week. Denies any new sexual partners. Has a Mirena for contraception.  Is also asking questions regarding going on a severe calorie restricted diet to give her a quick start to her weight loss. This is a liquid diet. Has not tried losing weight with a more moderate approach like limiting CHOs. Has not been exercising. Her current weight s 269# and BMI is 38.6 kg/m2. She is now 5 months postpartum. She also reports having momentary episodes of substernal "chest tightening" over the last few weeks. THe pain is momentary and is not associated with SOB, palpitations. On one occasion the pain radiated to the left side of her chest. Denies any current pain when moving her upper body or with taking a deep breath. Had heartburn with pregnancy, but none currently. Has moved recently and did lifting of some boxes.   Review of Systems:  Review of Systems  Constitutional: Positive for malaise/fatigue. Negative for chills, fever and weight loss.  HENT: Negative for congestion, sinus pain and sore throat.   Eyes: Negative for blurred vision and pain.  Respiratory: Negative for hemoptysis, shortness of breath and wheezing.   Cardiovascular: Positive for chest pain. Negative for palpitations and leg swelling.  Gastrointestinal: Negative for abdominal pain, blood in stool, diarrhea, heartburn, nausea and vomiting.  Genitourinary: Negative for dysuria, frequency, hematuria and urgency.       Positive for vaginal discharge, odor and vulvovaginal itching  Musculoskeletal: Negative for back pain, joint pain and myalgias.  Skin: Negative for itching and rash.  Neurological:  Negative for dizziness, tingling and headaches.  Endo/Heme/Allergies: Negative for environmental allergies and polydipsia. Does not bruise/bleed easily.       Negative for hirsutism   Psychiatric/Behavioral: Negative for depression. The patient is not nervous/anxious and does not have insomnia.     Past Medical History:  Past Medical History:  Diagnosis Date  . Gestational diabetes   . History of abnormal cervical Pap smear   . Obesity     Past Surgical History:  Past Surgical History:  Procedure Laterality Date  . INTRAUTERINE DEVICE (IUD) INSERTION      Gynecologic History: Patient's last menstrual period was 07/16/2020 (exact date).  Obstetric History: [id number]  Family History:  Family History  Problem Relation Age of Onset  . Hypertension Mother   . Diabetes Father        Type 2  . Hypertension Father   . Stroke Maternal Grandmother   . Leukemia Paternal Grandfather   . Hypertension Sister 52  . Hypertension Brother 30  . Other Maternal Grandfather        Malignant neoplasm    Social History:  Social History   Socioeconomic History  . Marital status: Married    Spouse name: Not on file  . Number of children: 2  . Years of education: Not on file  . Highest education level: Not on file  Occupational History  . Not on file  Tobacco Use  . Smoking status: Never Smoker  . Smokeless tobacco: Never Used  Vaping Use  . Vaping Use: Never used  Substance and Sexual Activity  . Alcohol use: No  .  Drug use: No  . Sexual activity: Yes    Partners: Male    Birth control/protection: I.U.D.  Other Topics Concern  . Not on file  Social History Narrative  . Not on file   Social Determinants of Health   Financial Resource Strain:   . Difficulty of Paying Living Expenses:   Food Insecurity:   . Worried About Programme researcher, broadcasting/film/video in the Last Year:   . Barista in the Last Year:   Transportation Needs:   . Freight forwarder (Medical):   Marland Kitchen Lack of  Transportation (Non-Medical):   Physical Activity:   . Days of Exercise per Week:   . Minutes of Exercise per Session:   Stress:   . Feeling of Stress :   Social Connections:   . Frequency of Communication with Friends and Family:   . Frequency of Social Gatherings with Friends and Family:   . Attends Religious Services:   . Active Member of Clubs or Organizations:   . Attends Banker Meetings:   Marland Kitchen Marital Status:   Intimate Partner Violence:   . Fear of Current or Ex-Partner:   . Emotionally Abused:   Marland Kitchen Physically Abused:   . Sexually Abused:     Allergies:  Allergies  Allergen Reactions  . Shellfish Allergy Shortness Of Breath    Medications: Prior to Admission medications   Medication Sig Start Date End Date Taking? Authorizing Provider  Prenat w/o A Vit-FeFum-FePo-FA (CONCEPT OB) 130-92.4-1 MG CAPS Take 1 capsule by mouth daily. 11/09/19  Yes Oswaldo Conroy, CNM  Physical Exam Vitals: BP 131/71   Pulse 65   Ht 5\' 10"  (1.778 m)   Wt 269 lb (122 kg)   LMP 07/16/2020 (Exact Date)   BMI 38.60 kg/m       Patient's last menstrual period was 07/16/2020 (exact date).  Physical Exam Constitutional:      General: She is not in acute distress.    Appearance: She is obese. She is not ill-appearing.  Cardiovascular:     Rate and Rhythm: Normal rate and regular rhythm.     Heart sounds: No murmur heard.  No friction rub.  Pulmonary:     Effort: Pulmonary effort is normal.  Genitourinary:    Comments: Vulva: mild inflammation of the vestibule Vagina: white discharge Cervix: IUD strings palpated Uterus: NSSC, decreased mobilty, NT Skin:    General: Skin is warm and dry.  Neurological:     Mental Status: She is alert.  Psychiatric:        Mood and Affect: Mood normal.   Wet prep positive for hyphae and  Negative for clue cells and Trich   Assessment: 34 y.o. 36 with monilial vulvovaginitis Obesity-desires weight loss Substernal chest  pain-suspect this may be muscular spasm  R/O cardiac etiology  Plan:   Diflucan 150 mgm every 3 days x 2 doses Lotrisone cream externally BID prn itching Cardiology referral Discussed plan for weight loss-is aware this plan is not sustainable over the long run, but may help kick start her weight loss.  [id number], CNM

## 2020-08-06 DIAGNOSIS — E669 Obesity, unspecified: Secondary | ICD-10-CM | POA: Insufficient documentation

## 2020-08-06 LAB — CHLAMYDIA/GONOCOCCUS/TRICHOMONAS, NAA
Chlamydia by NAA: NEGATIVE
Gonococcus by NAA: NEGATIVE
Trich vag by NAA: NEGATIVE

## 2020-08-06 LAB — POCT WET PREP (WET MOUNT): Trichomonas Wet Prep HPF POC: ABSENT

## 2022-02-20 ENCOUNTER — Other Ambulatory Visit: Payer: Self-pay

## 2022-06-28 ENCOUNTER — Other Ambulatory Visit: Payer: Self-pay | Admitting: Obstetrics and Gynecology

## 2022-07-20 ENCOUNTER — Encounter (HOSPITAL_COMMUNITY): Payer: Self-pay | Admitting: Urgent Care

## 2022-07-31 ENCOUNTER — Inpatient Hospital Stay: Admission: RE | Admit: 2022-07-31 | Payer: 59 | Source: Ambulatory Visit

## 2022-08-09 ENCOUNTER — Encounter: Admission: RE | Payer: Self-pay | Source: Ambulatory Visit

## 2022-08-09 ENCOUNTER — Ambulatory Visit: Admission: RE | Admit: 2022-08-09 | Payer: 59 | Source: Ambulatory Visit | Admitting: Obstetrics and Gynecology

## 2022-08-09 SURGERY — LIGATION, FALLOPIAN TUBE, LAPAROSCOPIC
Anesthesia: Choice | Laterality: Bilateral
